# Patient Record
Sex: Female | Born: 1995 | Hispanic: Yes | Marital: Single | Smoking: Never smoker
Health system: Southern US, Community
[De-identification: ages and names within clinical notes are randomized; demographics above are authoritative.]

## PROBLEM LIST (undated history)

## (undated) ENCOUNTER — Inpatient Hospital Stay (HOSPITAL_COMMUNITY): Payer: Self-pay

## (undated) DIAGNOSIS — J45909 Unspecified asthma, uncomplicated: Secondary | ICD-10-CM

## (undated) HISTORY — PX: TYMPANOSTOMY TUBE PLACEMENT: SHX32

## (undated) HISTORY — PX: TONSILLECTOMY: SUR1361

---
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
## 2016-11-07 ENCOUNTER — Encounter: Payer: Self-pay | Admitting: Family Medicine

## 2016-11-07 ENCOUNTER — Ambulatory Visit: Payer: Self-pay | Attending: Family Medicine | Admitting: Family Medicine

## 2016-11-07 VITALS — BP 136/83 | HR 102 | Temp 97.8°F | Resp 20 | Ht 64.0 in | Wt 206.0 lb

## 2016-11-07 DIAGNOSIS — J45909 Unspecified asthma, uncomplicated: Secondary | ICD-10-CM | POA: Insufficient documentation

## 2016-11-07 DIAGNOSIS — Z0000 Encounter for general adult medical examination without abnormal findings: Secondary | ICD-10-CM | POA: Insufficient documentation

## 2016-11-07 DIAGNOSIS — N926 Irregular menstruation, unspecified: Secondary | ICD-10-CM | POA: Insufficient documentation

## 2016-11-07 DIAGNOSIS — Z87891 Personal history of nicotine dependence: Secondary | ICD-10-CM | POA: Insufficient documentation

## 2016-11-07 DIAGNOSIS — Z8709 Personal history of other diseases of the respiratory system: Secondary | ICD-10-CM

## 2016-11-07 LAB — POCT URINALYSIS DIPSTICK
Bilirubin, UA: NEGATIVE
Blood, UA: NEGATIVE
Glucose, UA: NEGATIVE
KETONES UA: NEGATIVE
LEUKOCYTES UA: NEGATIVE
NITRITE UA: NEGATIVE
PH UA: 6.5
PROTEIN UA: 30
Spec Grav, UA: >=1.03
Urobilinogen, UA: 1

## 2016-11-07 LAB — POCT GLYCOSYLATED HEMOGLOBIN (HGB A1C): Hemoglobin A1C: 5.3

## 2016-11-07 LAB — POCT URINE PREGNANCY: PREG TEST UR: NEGATIVE

## 2016-11-07 MED ORDER — NORGESTIM-ETH ESTRAD TRIPHASIC 0.18/0.215/0.25 MG-35 MCG PO TABS: 1.0000 | ORAL_TABLET | Freq: Every day | ORAL | 11 refills | Status: DC

## 2016-11-07 MED ORDER — ALBUTEROL SULFATE HFA 108 (90 BASE) MCG/ACT IN AERS: 2.0000 | INHALATION_SPRAY | RESPIRATORY_TRACT | 0 refills | Status: DC | PRN

## 2016-11-07 NOTE — Progress Notes (Signed)
Establish care.  Denies any issues or concerns at this time

## 2016-11-16 NOTE — Progress Notes (Signed)
Subjective:   Chief Complaint  Patient presents with  . Annual Exam   HPI Anna Choi 20 y.o. female presents comes to office for annual physical examination. She denies any vision changes. She denies any CP or swelling of the extremities. She does report SOB. She does reports a history of asthma with inhaler use in the past. She denies any polyuria, polyphagia, or polyphagia. She denies any changes in bowel or bladder habits. She does report a history of irregular periods. She reports being without a menstrual cycle for up to 3 months at a time. She reports menstrual cycles are typically 4 days long. She does report oral contraceptive use in the past to help regulate cycles. She denies smoking. She denies any history of clotting disorders.  Social History   Social History  . Marital status: Single    Spouse name: N/A  . Number of children: N/A  . Years of education: N/A   Social History Main Topics  . Smoking status: Former Games developermoker  . Smokeless tobacco: Former NeurosurgeonUser  . Alcohol use No  . Drug use: No  . Sexual activity: Not on file   Social History Narrative  . No narrative on file   No outpatient prescriptions prior to visit.   No facility-administered medications prior to visit.    No Known Allergies  Review of Systems  Constitutional: Negative.   HENT: Negative.   Eyes: Negative.   Respiratory: Negative.   Cardiovascular: Negative.   Gastrointestinal: Negative.   Genitourinary: Negative.   Musculoskeletal: Negative.   Skin: Negative.   Neurological: Negative.   Psychiatric/Behavioral: Negative.      Objective:    Physical Exam  Constitutional: She is oriented to person, place, and time. She appears well-developed and well-nourished. No distress.  HENT:  Right Ear: External ear normal.  Left Ear: External ear normal.  Nose: Nose normal.  Mouth/Throat: Oropharynx is clear and moist. No oropharyngeal exudate.  Eyes: Conjunctivae and EOM are normal.  Pupils are equal, round, and reactive to light. Right eye exhibits no discharge. Left eye exhibits no discharge. No scleral icterus.  Neck: Normal range of motion. Neck supple. No JVD present.  Cardiovascular: Normal rate, regular rhythm, normal heart sounds and intact distal pulses.   Pulmonary/Chest: Effort normal and breath sounds normal. No respiratory distress.  Abdominal: Soft. Bowel sounds are normal. She exhibits no distension and no mass. There is no tenderness.  Musculoskeletal: Normal range of motion.  Lymphadenopathy:    She has no cervical adenopathy.  Neurological: She is alert and oriented to person, place, and time.  Skin: Skin is warm and dry. She is not diaphoretic.  Psychiatric: She has a normal mood and affect. Her behavior is normal. Thought content normal.   BP 136/83   Pulse (!) 102   Temp 97.8 F (36.6 C) (Oral)   Resp 20   Ht 5\' 4"  (1.626 m)   Wt 206 lb (93.4 kg)   LMP 07/08/2016 (Approximate)   SpO2 100%   BMI 35.36 kg/m  Wt Readings from Last 3 Encounters:  11/07/16 206 lb (93.4 kg)    There is no immunization history on file for this patient.  Lab Results  Component Value Date   HGBA1C 5.3 11/07/2016     Assessment & Plan:   Problem List Items Addressed This Visit    None    Visit Diagnoses    Encounter for annual physical exam    -  Primary   Relevant Orders  HgB A1c (Completed)   POCT urine pregnancy (Completed)   Vitamin D, 25-hydroxy   Basic Metabolic Panel   CBC with Differential   TSH   Urinalysis Dipstick (Completed)   Irregular periods       Relevant Medications   Norgestimate-Ethinyl Estradiol Triphasic (ORTHO TRI-CYCLEN, 28,) 0.18/0.215/0.25 MG-35 MCG tablet   Other Relevant Orders   Prolactin   FSH/LH   History of asthma       Relevant Medications   albuterol (PROVENTIL HFA;VENTOLIN HFA) 108 (90 Base) MCG/ACT inhaler     I am having Ms. Vetere start on albuterol and Norgestimate-Ethinyl Estradiol  Triphasic.  Meds ordered this encounter  Medications  . albuterol (PROVENTIL HFA;VENTOLIN HFA) 108 (90 Base) MCG/ACT inhaler    Sig: Inhale 2 puffs into the lungs every 4 (four) hours as needed for wheezing or shortness of breath.    Dispense:  1 Inhaler    Refill:  0    Order Specific Question:   Supervising Provider    Answer:   Quentin AngstJEGEDE, OLUGBEMIGA E [id number]  . Norgestimate-Ethinyl Estradiol Triphasic (ORTHO TRI-CYCLEN, 28,) 0.18/0.215/0.25 MG-35 MCG tablet    Sig: Take 1 tablet by mouth daily.    Dispense:  1 Package    Refill:  11    Order Specific Question:   Supervising Provider    Answer:   Quentin AngstJEGEDE, OLUGBEMIGA E [[id number]]    Arrie SenateMandesia Rashaan Wyles, FNP

## 2017-04-02 ENCOUNTER — Ambulatory Visit: Payer: Self-pay | Attending: Family Medicine

## 2017-04-03 ENCOUNTER — Encounter: Payer: Self-pay | Admitting: Family Medicine

## 2017-04-03 ENCOUNTER — Ambulatory Visit: Payer: Self-pay | Attending: Family Medicine | Admitting: Family Medicine

## 2017-04-03 VITALS — BP 133/80 | HR 86 | Temp 98.2°F | Resp 18 | Ht 64.0 in | Wt 207.2 lb

## 2017-04-03 DIAGNOSIS — Z8709 Personal history of other diseases of the respiratory system: Secondary | ICD-10-CM

## 2017-04-03 DIAGNOSIS — N911 Secondary amenorrhea: Secondary | ICD-10-CM

## 2017-04-03 DIAGNOSIS — J309 Allergic rhinitis, unspecified: Secondary | ICD-10-CM

## 2017-04-03 DIAGNOSIS — J45909 Unspecified asthma, uncomplicated: Secondary | ICD-10-CM | POA: Insufficient documentation

## 2017-04-03 DIAGNOSIS — J310 Chronic rhinitis: Secondary | ICD-10-CM | POA: Insufficient documentation

## 2017-04-03 DIAGNOSIS — M26623 Arthralgia of bilateral temporomandibular joint: Secondary | ICD-10-CM

## 2017-04-03 LAB — POCT URINE PREGNANCY: PREG TEST UR: NEGATIVE

## 2017-04-03 MED ORDER — IPRATROPIUM-ALBUTEROL 0.5-2.5 (3) MG/3ML IN SOLN: 3.0000 mL | RESPIRATORY_TRACT | Status: DC | PRN

## 2017-04-03 MED ORDER — MONTELUKAST SODIUM 10 MG PO TABS: 10.0000 mg | ORAL_TABLET | Freq: Every day | ORAL | 3 refills | Status: DC

## 2017-04-03 MED ORDER — ALBUTEROL SULFATE HFA 108 (90 BASE) MCG/ACT IN AERS: 2.0000 | INHALATION_SPRAY | RESPIRATORY_TRACT | 3 refills | Status: DC | PRN

## 2017-04-03 MED ORDER — ACETAMINOPHEN 500 MG PO TABS: 500.0000 mg | ORAL_TABLET | Freq: Four times a day (QID) | ORAL | 0 refills | Status: DC | PRN

## 2017-04-03 MED ORDER — FLUTICASONE PROPIONATE 50 MCG/ACT NA SUSP: 2.0000 | Freq: Every day | NASAL | 6 refills | Status: DC

## 2017-04-03 NOTE — Patient Instructions (Addendum)
Apply for orange card to complete referral process.  Asthma, Adult Asthma is a condition of the lungs in which the airways tighten and narrow. Asthma can make it hard to breathe. Asthma cannot be cured, but medicine and lifestyle changes can help control it. Asthma may be started (triggered) by:  Animal skin flakes (dander).  Dust.  Cockroaches.  Pollen.  Mold.  Smoke.  Cleaning products.  Hair sprays or aerosol sprays.  Paint fumes or strong smells.  Cold air, weather changes, and winds.  Crying or laughing hard.  Stress.  Certain medicines or drugs.  Foods, such as dried fruit, potato chips, and sparkling grape juice.  Infections or conditions (colds, flu).  Exercise.  Certain medical conditions or diseases.  Exercise or tiring activities. Follow these instructions at home:  Take medicine as told by your doctor.  Use a peak flow meter as told by your doctor. A peak flow meter is a tool that measures how well the lungs are working.  Record and keep track of the peak flow meter's readings.  Understand and use the asthma action plan. An asthma action plan is a written plan for taking care of your asthma and treating your attacks.  To help prevent asthma attacks:  Do not smoke. Stay away from secondhand smoke.  Change your heating and air conditioning filter often.  Limit your use of fireplaces and wood stoves.  Get rid of pests (such as roaches and mice) and their droppings.  Throw away plants if you see mold on them.  Clean your floors. Dust regularly. Use cleaning products that do not smell.  Have someone vacuum when you are not home. Use a vacuum cleaner with a HEPA filter if possible.  Replace carpet with wood, tile, or vinyl flooring. Carpet can trap animal skin flakes and dust.  Use allergy-proof pillows, mattress covers, and box spring covers.  Wash bed sheets and blankets every week in hot water and dry them in a dryer.  Use blankets that  are made of polyester or cotton.  Clean bathrooms and kitchens with bleach. If possible, have someone repaint the walls in these rooms with mold-resistant paint. Keep out of the rooms that are being cleaned and painted.  Wash hands often. Contact a doctor if:  You have make a whistling sound when breaking (wheeze), have shortness of breath, or have a cough even if taking medicine to prevent attacks.  The colored mucus you cough up (sputum) is thicker than usual.  The colored mucus you cough up changes from clear or white to yellow, green, gray, or bloody.  You have problems from the medicine you are taking such as:  A rash.  Itching.  Swelling.  Trouble breathing.  You need reliever medicines more than 2-3 times a week.  Your peak flow measurement is still at 50-79% of your personal best after following the action plan for 1 hour.  You have a fever. Get help right away if:  You seem to be worse and are not responding to medicine during an asthma attack.  You are short of breath even at rest.  You get short of breath when doing very little activity.  You have trouble eating, drinking, or talking.  You have chest pain.  You have a fast heartbeat.  Your lips or fingernails start to turn blue.  You are light-headed, dizzy, or faint.  Your peak flow is less than 50% of your personal best. This information is not intended to replace advice given  to you by your health care provider. Make sure you discuss any questions you have with your health care provider. Document Released: 04/29/2008 Document Revised: 04/18/2016 Document Reviewed: 06/10/2013 Elsevier Interactive Patient Education  2017 [employer].

## 2017-04-03 NOTE — Progress Notes (Signed)
Patient complains sneezing coughing runny nose both ears pain & inside the mouth pain when biting    Patient denies fever & chills Patient is not taking any current medication  Patient has not eaten for today

## 2017-04-04 ENCOUNTER — Ambulatory Visit: Payer: Self-pay | Attending: Family Medicine

## 2017-04-04 LAB — TSH: TSH: 2 u[IU]/mL (ref 0.450–4.500)

## 2017-04-04 LAB — PROLACTIN: PROLACTIN: 9.4 ng/mL (ref 4.8–23.3)

## 2017-04-04 LAB — FSH/LH
FSH: 6.3 m[IU]/mL
LH: 10 m[IU]/mL

## 2017-04-08 NOTE — Progress Notes (Signed)
Subjective:  Patient ID: Anna Choi, female    DOB: 07-08-1996  Age: 21 y.o. MRN: [id number]  CC: Establish Care   HPI Anna Choi presents for s here for evaluation of possible allergic rhinitis. Patient's symptoms include clear rhinorrhea, postnasal drip, pressure sensation in ears and sneezing. These symptoms are seasonal. Current triggers include exposure to pollens. The patient has been suffering from these symptoms for approximately several  weeks. The patient has not  Tried anything for symptoms.  Immunotherapy has never been tried. The patient has never had nasal polyps. The patient reports past medical history of asthma. She cannot recall the age at which was diagnosed. She reports being without her asthma medications for several years.The patient does not suffer from frequent sinopulmonary infections. The patient has not had sinus surgery in the past. The patient has no history of eczema. She also reports symptoms of pain at the mandibular joint when chewing and biting. She denies any history of bruxism. Denies any clicking or popping sensations.  History of secondary amenorrhea: She does report a history of irregular periods. She reports being without a menstrual cycle for up to 3 months at a time. She reports menstrual cycles are typically 4 days long. She does report oral contraceptive use in the past to help regulate cycles. She denies smoking. She denies any history of clotting disorders. Previous workup orders included: FSH/LH, prolactin, and  urine pregnancy. Was prescribed oral contraceptives previously  but denies taking.     Outpatient Medications Prior to Visit  Medication Sig Dispense Refill  . albuterol (PROVENTIL HFA;VENTOLIN HFA) 108 (90 Base) MCG/ACT inhaler Inhale 2 puffs into the lungs every 4 (four) hours as needed for wheezing or shortness of breath. (Patient not taking: Reported on 04/03/2017) 1 Inhaler 0  . Norgestimate-Ethinyl Estradiol Triphasic  (ORTHO TRI-CYCLEN, 28,) 0.18/0.215/0.25 MG-35 MCG tablet Take 1 tablet by mouth daily. 1 Package 11   No facility-administered medications prior to visit.     ROS Review of Systems  Constitutional: Negative.   HENT: Positive for ear pain, postnasal drip, rhinorrhea and sneezing.   Eyes: Negative.   Respiratory: Negative.   Cardiovascular: Negative.   Musculoskeletal: Positive for arthralgias.  Neurological: Negative.   Psychiatric/Behavioral: Negative.     Objective:  BP 133/80 (BP Location: Left Arm, Patient Position: Sitting, Cuff Size: Normal)   Pulse 86   Temp 98.2 F (36.8 C) (Oral)   Resp 18   Ht 5\' 4"  (1.626 m)   Wt 207 lb 3.2 oz (94 kg)   SpO2 99%   BMI 35.57 kg/m   BP/Weight 04/03/2017 11/07/2016  Systolic BP 133 136  Diastolic BP 80 83  Wt. (Lbs) 207.2 206  BMI 35.57 35.36    Physical Exam  Constitutional: She appears well-developed and well-nourished.  HENT:  Head: Normocephalic and atraumatic.  Right Ear: External ear normal.  Left Ear: External ear normal.  Nose: Nose normal.  Mouth/Throat: Oropharynx is clear and moist.  TMJ tenderness and pain with palpation when opening and closing mouth.  Eyes: Conjunctivae are normal. Pupils are equal, round, and reactive to light.  Neck: No JVD present.  Cardiovascular: Normal rate, regular rhythm, normal heart sounds and intact distal pulses.   Pulmonary/Chest: Effort normal. No respiratory distress. She has wheezes.  Abdominal: Soft. Bowel sounds are normal.  Skin: Skin is warm and dry.  Nursing note and vitals reviewed.  Assessment & Plan:   Problem List Items Addressed This Visit    None  Visit Diagnoses    Allergic rhinitis, unspecified seasonality, unspecified trigger    -  Primary   Relevant Medications   montelukast (SINGULAIR) 10 MG tablet   fluticasone (FLONASE) 50 MCG/ACT nasal spray   History of asthma       Due to pt.unclear self reported history of  asthma and presentation in office  today   Recommend referral to pulmonary for PFT to confirm asthma diagnosis.    Relevant Medications   albuterol (PROVENTIL HFA;VENTOLIN HFA) 108 (90 Base) MCG/ACT inhaler   ipratropium-albuterol (DUONEB) 0.5-2.5 (3) MG/3ML nebulizer solution 3 mL   Other Relevant Orders   Ambulatory referral to Pulmonology   Secondary amenorrhea       Relevant Orders   FSH/LH (Completed)   Prolactin (Completed)   TSH (Completed)   POCT urine pregnancy (Completed)   Ambulatory referral to Gynecology   TMJ tenderness, bilateral       Relevant Medications   acetaminophen (TYLENOL) 500 MG tablet   Other Relevant Orders   Ambulatory referral to ENT      Meds ordered this encounter  Medications  . albuterol (PROVENTIL HFA;VENTOLIN HFA) 108 (90 Base) MCG/ACT inhaler    Sig: Inhale 2 puffs into the lungs every 4 (four) hours as needed for wheezing or shortness of breath.    Dispense:  1 Inhaler    Refill:  3    Order Specific Question:   Supervising Provider    Answer:   Quentin Angst [id number]  . ipratropium-albuterol (DUONEB) 0.5-2.5 (3) MG/3ML nebulizer solution 3 mL  . montelukast (SINGULAIR) 10 MG tablet    Sig: Take 1 tablet (10 mg total) by mouth at bedtime.    Dispense:  90 tablet    Refill:  3    Order Specific Question:   Supervising Provider    Answer:   Quentin Angst [id number]  . fluticasone (FLONASE) 50 MCG/ACT nasal spray    Sig: Place 2 sprays into both nostrils daily.    Dispense:  16 g    Refill:  6    Order Specific Question:   Supervising Provider    Answer:   Quentin Angst [id number]  . acetaminophen (TYLENOL) 500 MG tablet    Sig: Take 1 tablet (500 mg total) by mouth every 6 (six) hours as needed for moderate pain.    Dispense:  30 tablet    Refill:  0    Order Specific Question:   Supervising Provider    Answer:   Quentin Angst [id number]    Follow-up: Return in about 1 month (around 05/04/2017) for Allergic rhinitis.   Lizbeth Bark  FNP

## 2017-04-10 ENCOUNTER — Telehealth: Payer: Self-pay

## 2017-04-10 NOTE — Telephone Encounter (Signed)
CMA patient call  regarding results  Patient Verify DOB  Patient was aware and understood

## 2017-04-10 NOTE — Telephone Encounter (Signed)
-----   Message from Lizbeth BarkMandesia R Hairston, FNP sent at 04/10/2017  3:03 PM EDT ----- FSH/LH indicate normal ovarian function. TSH/ prolactin levels are normal. When abnormal it can cause absence of menstrual cycles.  Follow up with gynecology referral.

## 2017-05-06 ENCOUNTER — Ambulatory Visit: Payer: Self-pay | Admitting: Family Medicine

## 2017-05-13 ENCOUNTER — Encounter: Payer: Self-pay | Admitting: Obstetrics & Gynecology

## 2017-06-23 ENCOUNTER — Encounter: Payer: Self-pay | Admitting: Obstetrics & Gynecology

## 2017-12-31 ENCOUNTER — Inpatient Hospital Stay (HOSPITAL_COMMUNITY)
Admission: AD | Admit: 2017-12-31 | Discharge: 2017-12-31 | Disposition: A | Discharge status: Home / Self Care | Payer: Medicaid Other | Source: Ambulatory Visit | Attending: Obstetrics and Gynecology | Admitting: Obstetrics and Gynecology

## 2017-12-31 ENCOUNTER — Encounter (HOSPITAL_COMMUNITY): Payer: Self-pay | Admitting: *Deleted

## 2017-12-31 ENCOUNTER — Other Ambulatory Visit: Payer: Self-pay

## 2017-12-31 DIAGNOSIS — Z87891 Personal history of nicotine dependence: Secondary | ICD-10-CM | POA: Insufficient documentation

## 2017-12-31 DIAGNOSIS — Z3A10 10 weeks gestation of pregnancy: Secondary | ICD-10-CM | POA: Insufficient documentation

## 2017-12-31 DIAGNOSIS — O219 Vomiting of pregnancy, unspecified: Secondary | ICD-10-CM | POA: Diagnosis not present

## 2017-12-31 DIAGNOSIS — Z79899 Other long term (current) drug therapy: Secondary | ICD-10-CM | POA: Insufficient documentation

## 2017-12-31 DIAGNOSIS — Z9889 Other specified postprocedural states: Secondary | ICD-10-CM | POA: Insufficient documentation

## 2017-12-31 DIAGNOSIS — Z3201 Encounter for pregnancy test, result positive: Secondary | ICD-10-CM

## 2017-12-31 DIAGNOSIS — O210 Mild hyperemesis gravidarum: Secondary | ICD-10-CM | POA: Diagnosis not present

## 2017-12-31 DIAGNOSIS — J45909 Unspecified asthma, uncomplicated: Secondary | ICD-10-CM | POA: Diagnosis not present

## 2017-12-31 DIAGNOSIS — M549 Dorsalgia, unspecified: Secondary | ICD-10-CM | POA: Diagnosis not present

## 2017-12-31 DIAGNOSIS — O99511 Diseases of the respiratory system complicating pregnancy, first trimester: Secondary | ICD-10-CM | POA: Insufficient documentation

## 2017-12-31 DIAGNOSIS — O26891 Other specified pregnancy related conditions, first trimester: Secondary | ICD-10-CM | POA: Insufficient documentation

## 2017-12-31 HISTORY — DX: Unspecified asthma, uncomplicated: J45.909

## 2017-12-31 LAB — URINALYSIS, ROUTINE W REFLEX MICROSCOPIC
BILIRUBIN URINE: NEGATIVE
Glucose, UA: NEGATIVE mg/dL
HGB URINE DIPSTICK: NEGATIVE
Ketones, ur: NEGATIVE mg/dL
NITRITE: NEGATIVE
PROTEIN: NEGATIVE mg/dL
SPECIFIC GRAVITY, URINE: 1.032 — AB (ref 1.005–1.030)
pH: 5 (ref 5.0–8.0)

## 2017-12-31 LAB — POCT PREGNANCY, URINE: PREG TEST UR: POSITIVE — AB

## 2017-12-31 MED ORDER — METOCLOPRAMIDE HCL 10 MG PO TABS: 10.0000 mg | ORAL_TABLET | Freq: Four times a day (QID) | ORAL | 0 refills | Status: DC

## 2017-12-31 MED ORDER — DOXYLAMINE-PYRIDOXINE 10-10 MG PO TBEC: 2.0000 | DELAYED_RELEASE_TABLET | Freq: Every day | ORAL | 0 refills | Status: DC

## 2017-12-31 MED ORDER — PROMETHAZINE HCL 25 MG PO TABS
12.5000 mg | ORAL_TABLET | Freq: Once | ORAL | Status: AC
  Administered 2017-12-31: 12.5 mg via ORAL
  Filled 2017-12-31: qty 1

## 2017-12-31 MED ORDER — RANITIDINE HCL 150 MG PO TABS: 150.0000 mg | ORAL_TABLET | Freq: Two times a day (BID) | ORAL | 0 refills | Status: DC

## 2017-12-31 NOTE — MAU Provider Note (Signed)
History     CSN: [id number]  Arrival date and time: 12/31/17 1003   First Provider Initiated Contact with Patient 12/31/17 1152      Chief Complaint  Patient presents with  . Back Pain  . Nausea   HPI  Ms.  Anna Choi is a 22 y.o. year old G1P0 female at [redacted]w[redacted]d weeks gestation who presents to MAU reporting back pain, amenorrhea, nausea, and sore breasts for about 1-2 months. She states "I have a feeling I'm pregnant, but I have not taken a test at home. This is an unplanned, but desired pregnancy.   Past Medical History:  Diagnosis Date  . Asthma     Past Surgical History:  Procedure Laterality Date  . TONSILLECTOMY      History reviewed. No pertinent family history.  Social History   Tobacco Use  . Smoking status: Former Games developer  . Smokeless tobacco: Former Engineer, water Use Topics  . Alcohol use: No  . Drug use: No    Allergies: No Known Allergies  Facility-Administered Medications Prior to Admission  Medication Dose Route Frequency Provider Last Rate Last Dose  . ipratropium-albuterol (DUONEB) 0.5-2.5 (3) MG/3ML nebulizer solution 3 mL  3 mL Nebulization Q20 Min PRN Arrie Senate R, FNP       Medications Prior to Admission  Medication Sig Dispense Refill Last Dose  . acetaminophen (TYLENOL) 500 MG tablet Take 1 tablet (500 mg total) by mouth every 6 (six) hours as needed for moderate pain. 30 tablet 0   . albuterol (PROVENTIL HFA;VENTOLIN HFA) 108 (90 Base) MCG/ACT inhaler Inhale 2 puffs into the lungs every 4 (four) hours as needed for wheezing or shortness of breath. 1 Inhaler 3   . fluticasone (FLONASE) 50 MCG/ACT nasal spray Place 2 sprays into both nostrils daily. 16 g 6   . montelukast (SINGULAIR) 10 MG tablet Take 1 tablet (10 mg total) by mouth at bedtime. 90 tablet 3     Review of Systems  Constitutional: Negative.   HENT: Negative.   Eyes: Negative.   Cardiovascular: Negative.   Gastrointestinal: Positive for diarrhea and  vomiting. Constipation: a few weeks ago; now resolved.  Endocrine: Negative.   Genitourinary: Negative.   Musculoskeletal: Positive for back pain.  Skin: Negative.   Allergic/Immunologic: Negative.   Neurological: Negative.   Hematological: Negative.   Psychiatric/Behavioral: Negative.    Physical Exam   Blood pressure 115/61, pulse 84, temperature 98.4 F (36.9 C), temperature source Oral, resp. rate 16, weight 184 lb 12 oz (83.8 kg), last menstrual period 10/18/2017, SpO2 100 %.  Physical Exam  Nursing note and vitals reviewed. Constitutional: She is oriented to person, place, and time. She appears well-developed and well-nourished.  HENT:  Head: Normocephalic.  Eyes: Pupils are equal, round, and reactive to light.  Neck: Normal range of motion.  Cardiovascular: Normal rate, regular rhythm and normal heart sounds.  Respiratory: Effort normal and breath sounds normal.  GI: Soft. Bowel sounds are normal.  Genitourinary:  Genitourinary Comments: Pelvic deferred  Musculoskeletal: Normal range of motion.  Neurological: She is alert and oriented to person, place, and time. She has normal reflexes.  Skin: Skin is warm and dry.  Psychiatric: She has a normal mood and affect. Her behavior is normal. Judgment and thought content normal.    MAU Course  Procedures  MDM CCUA UPT UCx Phenergan 12.5 mg po -- nausea improved, tolerating PO fluids  Results for orders placed or performed during the hospital encounter of 12/31/17 (from  the past 24 hour(s))  Urinalysis, Routine w reflex microscopic     Status: Abnormal   Collection Time: 12/31/17 10:03 AM  Result Value Ref Range   Color, Urine YELLOW YELLOW   APPearance HAZY (A) CLEAR   Specific Gravity, Urine 1.032 (H) 1.005 - 1.030   pH 5.0 5.0 - 8.0   Glucose, UA NEGATIVE NEGATIVE mg/dL   Hgb urine dipstick NEGATIVE NEGATIVE   Bilirubin Urine NEGATIVE NEGATIVE   Ketones, ur NEGATIVE NEGATIVE mg/dL   Protein, ur NEGATIVE  NEGATIVE mg/dL   Nitrite NEGATIVE NEGATIVE   Leukocytes, UA MODERATE (A) NEGATIVE   RBC / HPF 0-5 0 - 5 RBC/hpf   WBC, UA TOO NUMEROUS TO COUNT 0 - 5 WBC/hpf   Bacteria, UA RARE (A) NONE SEEN   Squamous Epithelial / LPF 0-5 (A) NONE SEEN   Mucus PRESENT   Pregnancy, urine POC     Status: Abnormal   Collection Time: 12/31/17 10:34 AM  Result Value Ref Range   Preg Test, Ur POSITIVE (A) NEGATIVE    Assessment and Plan  Morning sickness - Rx for Reglan 10 mg every 6 hrs prn N/V - Rx for Zantac 150 mg BID - Rx for Diclegis 10 mg 2 tabs hs - Information provided on morning sickness, eating plan for N/V, prenatal care - List of [place] [facility] providers given - List of Safe Medications in pregnancy given - Discharge home - Patient verbalized an understanding of the plan of care and agrees.   Raelyn Moraolitta Hilton Saephan, MSN, CNM 12/31/2017, 11:59 AM

## 2017-12-31 NOTE — MAU Note (Signed)
Back pain and nausea, started about 2 months ago.  Has not done a home test.

## 2017-12-31 NOTE — Discharge Instructions (Signed)
[facility]  ° ° °[facility]       Phone: [phone] ° °[facility] Phone: [phone] ° °[facility]  Phone: [phone] ° °[facility]  Phone: [phone] ° °[facility]  Phone: [phone] ° °[facility]       Phone: [phone] ° °[facility]    Phone: [phone]  ° °[facility] ([place]    Phone: [phone] ° °[facility]    Phone: [phone] ° °[facility]    Phone: [phone] ° °[facility]    Phone: [phone] ° °[facility]       Phone: [phone]  ° °[facility]  Phone: [phone] ° °[facility]    Phone: [phone] ° °[facility]   Phone: [phone] ° °[facility]      Phone: [phone] ° °[facility]    Phone: [phone] ° °Safe Medications in Pregnancy  ° °Acne: °Benzoyl Peroxide °Salicylic Acid ° °Backache/Headache: °Tylenol: 2 regular strength every 4 hours OR °             2 Extra strength every 6 hours ° °Colds/Coughs/Allergies: °Benadryl (alcohol free) 25 mg every 6 hours as needed °Breath right strips °Claritin °Cepacol throat lozenges °Chloraseptic throat spray °Cold-Eeze- up to three times per day °Cough drops, alcohol free °Flonase (by prescription only) °Guaifenesin °Mucinex °Robitussin DM (plain only, alcohol free) °Saline nasal spray/drops °Sudafed (pseudoephedrine) & Actifed ** use only after [redacted] weeks gestation and if you do not have high blood pressure °Tylenol °Vicks Vaporub °Zinc lozenges °Zyrtec  ° °Constipation: °Colace °Ducolax suppositories °Fleet enema °Glycerin suppositories °Metamucil °Milk of  magnesia °Miralax °Senokot °Smooth move tea ° °Diarrhea: °Kaopectate °Imodium A-D ° °*NO pepto Bismol ° °Hemorrhoids: °Anusol °Anusol HC °Preparation H °Tucks ° °Indigestion: °Tums °Maalox °Mylanta °Zantac  °Pepcid ° °Insomnia: °Benadryl (alcohol free) 25mg every 6 hours as needed °Tylenol PM °Unisom, no Gelcaps ° °Leg Cramps: °Tums °MagGel ° °Nausea/Vomiting:  °Bonine °Dramamine °Emetrol °Ginger extract °Sea bands °Meclizine  °Nausea medication to take during pregnancy:  °Unisom (doxylamine succinate 25 mg tablets) Take one tablet daily at bedtime. If symptoms are not adequately controlled, the dose can be increased to a maximum recommended dose of two tablets daily (1/2 tablet in the morning, 1/2 tablet mid-afternoon and one at bedtime). °Vitamin B6 100mg tablets. Take one tablet twice a day (up to 200 mg per day). ° °Skin Rashes: °Aveeno products °Benadryl cream or 25mg every 6 hours as needed °Calamine Lotion °1% cortisone cream ° °Yeast infection: °Gyne-lotrimin 7 °Monistat 7 ° ° °**If taking multiple medications, please check labels to avoid duplicating the same active ingredients °**take medication as directed on the label °** Do not exceed 4000 mg of tylenol in 24 hours °**Do not take medications that contain aspirin or ibuprofen ° ° ° ° °

## 2018-01-02 ENCOUNTER — Telehealth: Payer: Self-pay | Admitting: Obstetrics and Gynecology

## 2018-01-02 LAB — CULTURE, OB URINE
Culture: >=100000 — AB
SPECIAL REQUESTS: NORMAL

## 2018-01-02 MED ORDER — CEPHALEXIN 500 MG PO CAPS: 500.0000 mg | ORAL_CAPSULE | Freq: Four times a day (QID) | ORAL | 0 refills | Status: DC

## 2018-01-02 NOTE — Telephone Encounter (Signed)
Notified of (+) UCx results and Rx for Keflex sent to pharmacy. Advised to take as directed. Patient verbalized an understanding of the plan of care and agrees.   Raelyn MoraRolitta Gudelia Eugene, CNM  01/02/2018 12:34 PM

## 2018-01-21 ENCOUNTER — Encounter: Payer: Self-pay | Admitting: Obstetrics and Gynecology

## 2018-01-21 ENCOUNTER — Other Ambulatory Visit (HOSPITAL_COMMUNITY)
Admission: RE | Admit: 2018-01-21 | Discharge: 2018-01-21 | Disposition: A | Discharge status: Home / Self Care | Payer: Medicaid Other | Source: Ambulatory Visit | Attending: Obstetrics and Gynecology | Admitting: Obstetrics and Gynecology

## 2018-01-21 ENCOUNTER — Ambulatory Visit (INDEPENDENT_AMBULATORY_CARE_PROVIDER_SITE_OTHER): Payer: Medicaid Other | Admitting: Obstetrics and Gynecology

## 2018-01-21 ENCOUNTER — Other Ambulatory Visit: Payer: Self-pay

## 2018-01-21 VITALS — BP 125/62 | HR 84 | Wt 188.8 lb

## 2018-01-21 DIAGNOSIS — Z3400 Encounter for supervision of normal first pregnancy, unspecified trimester: Secondary | ICD-10-CM | POA: Insufficient documentation

## 2018-01-21 DIAGNOSIS — Z3402 Encounter for supervision of normal first pregnancy, second trimester: Secondary | ICD-10-CM | POA: Insufficient documentation

## 2018-01-21 DIAGNOSIS — O99719 Diseases of the skin and subcutaneous tissue complicating pregnancy, unspecified trimester: Secondary | ICD-10-CM

## 2018-01-21 DIAGNOSIS — L811 Chloasma: Secondary | ICD-10-CM

## 2018-01-21 DIAGNOSIS — O99711 Diseases of the skin and subcutaneous tissue complicating pregnancy, first trimester: Secondary | ICD-10-CM

## 2018-01-21 LAB — POCT URINALYSIS DIP (DEVICE)
GLUCOSE, UA: NEGATIVE mg/dL
Hgb urine dipstick: NEGATIVE
KETONES UR: NEGATIVE mg/dL
LEUKOCYTES UA: NEGATIVE
NITRITE: NEGATIVE
PROTEIN: NEGATIVE mg/dL
UROBILINOGEN UA: 0.2 mg/dL (ref 0.0–1.0)
pH: 5.5 (ref 5.0–8.0)

## 2018-01-21 MED ORDER — PRENATAL 27-0.8 MG PO TABS: 1.0000 | ORAL_TABLET | Freq: Every day | ORAL | 12 refills | Status: DC

## 2018-01-21 MED ORDER — RANITIDINE HCL 150 MG PO TABS: 150.0000 mg | ORAL_TABLET | Freq: Two times a day (BID) | ORAL | 0 refills | Status: DC

## 2018-01-21 MED ORDER — DOXYLAMINE-PYRIDOXINE 10-10 MG PO TBEC: 2.0000 | DELAYED_RELEASE_TABLET | Freq: Every day | ORAL | 0 refills | Status: DC

## 2018-01-21 MED ORDER — METOCLOPRAMIDE HCL 10 MG PO TABS: 10.0000 mg | ORAL_TABLET | Freq: Four times a day (QID) | ORAL | 0 refills | Status: DC

## 2018-01-21 NOTE — Patient Instructions (Signed)
Morning Sickness Morning sickness is when you feel sick to your stomach (nauseous) during pregnancy. You may feel sick to your stomach and throw up (vomit). You may feel sick in the morning, but you can feel this way any time of day. Some women feel very sick to their stomach and cannot stop throwing up (hyperemesis gravidarum). Follow these instructions at home: Only take medicines as told by your doctor. Take multivitamins as told by your doctor. Taking multivitamins before getting pregnant can stop or lessen the harshness of morning sickness. Eat dry toast or unsalted crackers before getting out of bed. Eat 5 to 6 small meals a day. Eat dry and bland foods like rice and baked potatoes. Do not drink liquids with meals. Drink between meals. Do not eat greasy, fatty, or spicy foods. Have someone cook for you if the smell of food causes you to feel sick or throw up. If you feel sick to your stomach after taking prenatal vitamins, take them at night or with a snack. Eat protein when you need a snack (nuts, yogurt, cheese). Eat unsweetened gelatins for dessert. Wear a bracelet used for sea sickness (acupressure wristband). Go to a doctor that puts thin needles into certain body points (acupuncture) to improve how you feel. Do not smoke. Use a humidifier to keep the air in your house free of odors. Get lots of fresh air. Contact a doctor if: You need medicine to feel better. You feel dizzy or lightheaded. You are losing weight. Get help right away if: You feel very sick to your stomach and cannot stop throwing up. You pass out (faint). This information is not intended to replace advice given to you by your health care provider. Make sure you discuss any questions you have with your health care provider. Document Released: 12/19/2004 Document Revised: 04/18/2016 Document Reviewed: 04/28/2013 Elsevier Interactive Patient Education  2017 Elsevier Inc. Eating Plan for Hyperemesis  Gravidarum Hyperemesis gravidarum is a severe form of morning sickness. Because this condition causes severe nausea and vomiting, it can lead to dehydration, malnutrition, and weight loss. One way to lessen the symptoms of nausea and vomiting is to follow the eating plan for hyperemesis gravidarum. It is often used along with prescribed medicines to control your symptoms. What can I do to relieve my symptoms? Listen to your body. Everyone is different and has different preferences. Find what works best for you. Take any of the following actions that are helpful to you:  Eat and drink slowly.  Eat 5-6 small meals daily instead of 3 large meals.  Eat crackers before you get out of bed in the morning.  Try having a snack in the middle of the night.  Starchy foods are usually tolerated well. Examples include cereal, toast, bread, potatoes, pasta, rice, and pretzels.  Ginger may help with nausea. Add  tsp ground ginger to hot tea or choose ginger tea.  Try drinking 100% fruit juice or an electrolyte drink. An electrolyte drink contains sodium, potassium, and chloride.  Continue to take your prenatal vitamins as told by your health care provider. If you are having trouble taking your prenatal vitamins, talk with your health care provider about different options.  Include at least 1 serving of protein with your meals and snacks. Protein options include meats or poultry, beans, nuts, eggs, and yogurt. Try eating a protein-rich snack before bed. Examples of these snacks include cheese and crackers or half of a peanut butter or [place] sandwich.  Consider eliminating foods that  trigger your symptoms. These may include spicy foods, coffee, high-fat foods, very sweet foods, and acidic foods.  Try meals that have more protein combined with bland, salty, lower-fat, and dry foods, such as nuts, seeds, pretzels, crackers, and cereal.  Talk with your healthcare provider about starting a supplement of  vitamin B6.  Have fluids that are cold, clear, and carbonated or sour. Examples include lemonade, ginger ale, lemon-lime soda, ice water, and sparkling water.  Try lemon or mint tea.  Try brushing your teeth or using a mouth rinse after meals.  What should I avoid to reduce my symptoms? Avoiding some of the following things may help reduce your symptoms.  Foods with strong smells. Try eating meals in well-ventilated areas that are free of odors.  Drinking water or other beverages with meals. Try not to drink anything during the 30 minutes before and after your meals.  Drinking more than 1 cup of fluid at a time. Sometimes using a straw helps.  Fried or high-fat foods, such as butter and cream sauces.  Spicy foods.  Skipping meals as best as you can. Nausea can be more intense on an empty stomach. If you cannot tolerate food at that time, do not force it. Try sucking on ice chips or other frozen items, and make up for missed calories later.  Lying down within 2 hours after eating.  Environmental triggers. These may include smoky rooms, closed spaces, rooms with strong smells, warm or humid places, overly loud and noisy rooms, and rooms with motion or flickering lights.  Quick and sudden changes in your movement.  This information is not intended to replace advice given to you by your health care provider. Make sure you discuss any questions you have with your health care provider. Document Released: 09/08/2007 Document Revised: 07/10/2016 Document Reviewed: 06/11/2016 Elsevier Interactive Patient Education  2018 [employer]. Second Trimester of Pregnancy The second trimester is from week 13 through week 28, month 4 through 6. This is often the time in pregnancy that you feel your best. Often times, morning sickness has lessened or quit. You may have more energy, and you may get hungry more often. Your unborn baby (fetus) is growing rapidly. At the end of the sixth month, he or she  is about 9 inches long and weighs about 1 pounds. You will likely feel the baby move (quickening) between 18 and 20 weeks of pregnancy. Follow these instructions at home:  Avoid all smoking, herbs, and alcohol. Avoid drugs not approved by your doctor.  Do not use any tobacco products, including cigarettes, chewing tobacco, and electronic cigarettes. If you need help quitting, ask your doctor. You may get counseling or other support to help you quit.  Only take medicine as told by your doctor. Some medicines are safe and some are not during pregnancy.  Exercise only as told by your doctor. Stop exercising if you start having cramps.  Eat regular, healthy meals.  Wear a good support bra if your breasts are tender.  Do not use hot tubs, steam rooms, or saunas.  Wear your seat belt when driving.  Avoid raw meat, uncooked cheese, and liter boxes and soil used by cats.  Take your prenatal vitamins.  Take 1500-2000 milligrams of calcium daily starting at the 20th week of pregnancy until you deliver your baby.  Try taking medicine that helps you poop (stool softener) as needed, and if your doctor approves. Eat more fiber by eating fresh fruit, vegetables, and whole grains. Drink  enough fluids to keep your pee (urine) clear or pale yellow.  Take warm water baths (sitz baths) to soothe pain or discomfort caused by hemorrhoids. Use hemorrhoid cream if your doctor approves.  If you have puffy, bulging veins (varicose veins), wear support hose. Raise (elevate) your feet for 15 minutes, 3-4 times a day. Limit salt in your diet.  Avoid heavy lifting, wear low heals, and sit up straight.  Rest with your legs raised if you have leg cramps or low back pain.  Visit your dentist if you have not gone during your pregnancy. Use a soft toothbrush to brush your teeth. Be gentle when you floss.  You can have sex (intercourse) unless your doctor tells you not to.  Go to your doctor visits. Get help  if:  You feel dizzy.  You have mild cramps or pressure in your lower belly (abdomen).  You have a nagging pain in your belly area.  You continue to feel sick to your stomach (nauseous), throw up (vomit), or have watery poop (diarrhea).  You have bad smelling fluid coming from your vagina.  You have pain with peeing (urination). Get help right away if:  You have a fever.  You are leaking fluid from your vagina.  You have spotting or bleeding from your vagina.  You have severe belly cramping or pain.  You lose or gain weight rapidly.  You have trouble catching your breath and have chest pain.  You notice sudden or extreme puffiness (swelling) of your face, hands, ankles, feet, or legs.  You have not felt the baby move in over an hour.  You have severe headaches that do not go away with medicine.  You have vision changes. This information is not intended to replace advice given to you by your health care provider. Make sure you discuss any questions you have with your health care provider. Document Released: 02/05/2010 Document Revised: 04/18/2016 Document Reviewed: 01/12/2013 Elsevier Interactive Patient Education  2017 [employer]

## 2018-01-21 NOTE — Progress Notes (Signed)
  Subjective:    Anna Choi is being seen today for her first obstetrical visit.  This is not a planned pregnancy. She is at 4139w4d gestation. Her obstetrical history is significant for obesity. Relationship with FOB: significant other, not living together. Patient does intend to breast feed. Pregnancy history fully reviewed.  Patient reports heartburn, nausea and vomiting.  Review of Systems:   Review of Systems  Constitutional: Negative.   HENT: Negative.   Eyes: Negative.   Respiratory: Negative.   Cardiovascular: Negative.   Gastrointestinal: Positive for nausea and vomiting.  Endocrine: Negative.   Genitourinary: Negative.   Musculoskeletal: Negative.   Skin: Negative.   Allergic/Immunologic: Negative.   Neurological: Negative.   Hematological: Negative.   Psychiatric/Behavioral: Negative.     Objective:     BP 125/62   Pulse 84   Wt 188 lb 12.8 oz (85.6 kg)   LMP 10/18/2017 (Exact Date)   BMI 32.41 kg/m  Physical Exam  Nursing note and vitals reviewed. Constitutional: She is oriented to person, place, and time. She appears well-developed and well-nourished.  HENT:  Head: Normocephalic and atraumatic.  Right Ear: External ear normal.  Left Ear: External ear normal.  Nose: Nose normal.  Mouth/Throat: Oropharynx is clear and moist.  Eyes: Conjunctivae and EOM are normal. Pupils are equal, round, and reactive to light.  Neck: Normal range of motion. Neck supple.  Cardiovascular: Normal rate, regular rhythm, normal heart sounds and intact distal pulses.  Respiratory: Effort normal and breath sounds normal.  GI: Soft. Bowel sounds are normal.  Genitourinary: Vaginal discharge (moderate amt) found.  Genitourinary Comments: Uterus: enlarged, S=D, cx: smooth, pink, no lesions, moderate amt of thick, white vaginal d/c -- no wet prep done d/t no compliaints of vaginal irritation, burning, odor or itching, closed/long/firm, no CMT or friability, no adnexal  tenderness   Musculoskeletal: Normal range of motion.  Neurological: She is alert and oriented to person, place, and time. She has normal reflexes.  Skin: Skin is warm and dry.  Erythematous areas on forehead, nose, cheeks, and chin / ?rash vs. molasma of pregnancy on face - will need derm referral  Psychiatric: She has a normal mood and affect. Her behavior is normal. Judgment and thought content normal.    Maternal Exam:  Abdomen: Patient reports no abdominal tenderness. Introitus: Normal vulva. Vagina is positive for vaginal discharge (moderate amt).  Ferning test: not done.  Nitrazine test: not done. Amniotic fluid character: not assessed.  Pelvis: adequate for delivery.   Cervix: Cervix evaluated by sterile speculum exam and digital exam.    Dilation: Closed Effacement (%): Thick     Assessment:    Pregnancy: G1P0 Patient Active Problem List   Diagnosis Date Noted  . Supervision of normal first pregnancy, antepartum 01/21/2018  . Morning sickness 12/31/2017  . Positive urine pregnancy test 12/31/2017       Plan:     Initial labs drawn. Prenatal vitamins. Problem list reviewed and updated. AFP3 discussed: undecided. Role of ultrasound in pregnancy discussed; fetal survey: ordered. Discussed with patient that U/S was not medically indicated from today's visit. Anticipatory guidance given that anatomy U/S will be at 18 wks. Amniocentesis discussed: not indicated. Follow up in 7 weeks. Enrolled in [employer] optimized scheduling. 100% of 20 min visit spent on counseling and coordination of care.  Patient verbalized an understanding of the plan of care and agrees.    Raelyn Moraolitta Kentarius Partington, MSN, CNM 01/21/2018

## 2018-01-23 ENCOUNTER — Encounter: Payer: Self-pay | Admitting: *Deleted

## 2018-01-23 LAB — CYTOLOGY - PAP
CHLAMYDIA, DNA PROBE: NEGATIVE
NEISSERIA GONORRHEA: NEGATIVE

## 2018-01-23 LAB — CULTURE, OB URINE

## 2018-01-23 LAB — URINE CULTURE, OB REFLEX: ORGANISM ID, BACTERIA: NO GROWTH

## 2018-01-27 ENCOUNTER — Encounter (HOSPITAL_COMMUNITY): Payer: Self-pay | Admitting: *Deleted

## 2018-01-27 ENCOUNTER — Inpatient Hospital Stay (HOSPITAL_COMMUNITY)
Admission: AD | Admit: 2018-01-27 | Discharge: 2018-01-27 | Disposition: A | Discharge status: Home / Self Care | Payer: Medicaid Other | Source: Ambulatory Visit | Attending: Obstetrics and Gynecology | Admitting: Obstetrics and Gynecology

## 2018-01-27 DIAGNOSIS — R1012 Left upper quadrant pain: Secondary | ICD-10-CM | POA: Insufficient documentation

## 2018-01-27 DIAGNOSIS — O219 Vomiting of pregnancy, unspecified: Secondary | ICD-10-CM

## 2018-01-27 DIAGNOSIS — R109 Unspecified abdominal pain: Secondary | ICD-10-CM | POA: Diagnosis present

## 2018-01-27 DIAGNOSIS — N760 Acute vaginitis: Secondary | ICD-10-CM | POA: Diagnosis not present

## 2018-01-27 DIAGNOSIS — Z3A14 14 weeks gestation of pregnancy: Secondary | ICD-10-CM | POA: Diagnosis not present

## 2018-01-27 DIAGNOSIS — O26892 Other specified pregnancy related conditions, second trimester: Secondary | ICD-10-CM | POA: Diagnosis present

## 2018-01-27 DIAGNOSIS — B9689 Other specified bacterial agents as the cause of diseases classified elsewhere: Secondary | ICD-10-CM

## 2018-01-27 DIAGNOSIS — O210 Mild hyperemesis gravidarum: Secondary | ICD-10-CM | POA: Diagnosis not present

## 2018-01-27 DIAGNOSIS — Z3201 Encounter for pregnancy test, result positive: Secondary | ICD-10-CM

## 2018-01-27 DIAGNOSIS — O23592 Infection of other part of genital tract in pregnancy, second trimester: Secondary | ICD-10-CM | POA: Diagnosis not present

## 2018-01-27 LAB — WET PREP, GENITAL
Sperm: NONE SEEN
Trich, Wet Prep: NONE SEEN
YEAST WET PREP: NONE SEEN

## 2018-01-27 LAB — URINALYSIS, ROUTINE W REFLEX MICROSCOPIC
Bilirubin Urine: NEGATIVE
Glucose, UA: NEGATIVE mg/dL
Hgb urine dipstick: NEGATIVE
Ketones, ur: NEGATIVE mg/dL
Leukocytes, UA: NEGATIVE
NITRITE: NEGATIVE
PROTEIN: NEGATIVE mg/dL
SPECIFIC GRAVITY, URINE: 1.03 (ref 1.005–1.030)
pH: 5 (ref 5.0–8.0)

## 2018-01-27 MED ORDER — METRONIDAZOLE 500 MG PO TABS: 500.0000 mg | ORAL_TABLET | Freq: Two times a day (BID) | ORAL | 0 refills | Status: DC

## 2018-01-27 NOTE — MAU Note (Signed)
Patient states she is having sharp epigastric abdominal pain for past four days.  Reports she started a new job with heavy lifting 4 days ago and "I'm not sure if it's related to lifting too much at my new job."  Denies vaginal bleeding, but states having foul smelling discharge and vaginal itching.  Rates pain 7/10 but hasn't taken any medications for it.

## 2018-01-27 NOTE — Discharge Instructions (Signed)
Bacterial Vaginosis Bacterial vaginosis is a vaginal infection that occurs when the normal balance of bacteria in the vagina is disrupted. It results from an overgrowth of certain bacteria. This is the most common vaginal infection among women ages 7-44. Because bacterial vaginosis increases your risk for STIs (sexually transmitted infections), getting treated can help reduce your risk for chlamydia, gonorrhea, herpes, and HIV (human immunodeficiency virus). Treatment is also important for preventing complications in pregnant women, because this condition can cause an early (premature) delivery. What are the causes? This condition is caused by an increase in harmful bacteria that are normally present in small amounts in the vagina. However, the reason that the condition develops is not fully understood. What increases the risk? The following factors may make you more likely to develop this condition:  Having a new sexual partner or multiple sexual partners.  Having unprotected sex.  Douching.  Having an intrauterine device (IUD).  Smoking.  Drug and alcohol abuse.  Taking certain antibiotic medicines.  Being pregnant.  You cannot get bacterial vaginosis from toilet seats, bedding, swimming pools, or contact with objects around you. What are the signs or symptoms? Symptoms of this condition include:  Grey or white vaginal discharge. The discharge can also be watery or foamy.  A fish-like odor with discharge, especially after sexual intercourse or during menstruation.  Itching in and around the vagina.  Burning or pain with urination.  Some women with bacterial vaginosis have no signs or symptoms. How is this diagnosed? This condition is diagnosed based on:  Your medical history.  A physical exam of the vagina.  Testing a sample of vaginal fluid under a microscope to look for a large amount of bad bacteria or abnormal cells. Your health care provider may use a cotton swab  or a small wooden spatula to collect the sample.  How is this treated? This condition is treated with antibiotics. These may be given as a pill, a vaginal cream, or a medicine that is put into the vagina (suppository). If the condition comes back after treatment, a second round of antibiotics may be needed. Follow these instructions at home: Medicines  Take over-the-counter and prescription medicines only as told by your health care provider.  Take or use your antibiotic as told by your health care provider. Do not stop taking or using the antibiotic even if you start to feel better. General instructions  If you have a female sexual partner, tell her that you have a vaginal infection. She should see her health care provider and be treated if she has symptoms. If you have a female sexual partner, he does not need treatment.  During treatment: ? Avoid sexual activity until you finish treatment. ? Do not douche. ? Avoid alcohol as directed by your health care provider. ? Avoid breastfeeding as directed by your health care provider.  Drink enough water and fluids to keep your urine clear or pale yellow.  Keep the area around your vagina and rectum clean. ? Wash the area daily with warm water. ? Wipe yourself from front to back after using the toilet.  Keep all follow-up visits as told by your health care provider. This is important. How is this prevented?  Do not douche.  Wash the outside of your vagina with warm water only.  Use protection when having sex. This includes latex condoms and dental dams.  Limit how many sexual partners you have. To help prevent bacterial vaginosis, it is best to have sex with just  one partner (monogamous).  Make sure you and your sexual partner are tested for STIs.  Wear cotton or cotton-lined underwear.  Avoid wearing tight pants and pantyhose, especially during summer.  Limit the amount of alcohol that you drink.  Do not use any products that  contain nicotine or tobacco, such as cigarettes and e-cigarettes. If you need help quitting, ask your health care provider.  Do not use illegal drugs. Where to find more information:  Centers for Disease Control and Prevention: SolutionApps.co.zawww.cdc.gov/std  American Sexual Health Association (ASHA): www.ashastd.org  U.S. Department of Health and Health and safety inspectorHuman Services, Office on Women's Health: ConventionalMedicines.siwww.womenshealth.gov/ or http://www.anderson-williamson.info/https://www.womenshealth.gov/a-z-topics/bacterial-vaginosis Contact a health care provider if:  Your symptoms do not improve, even after treatment.  You have more discharge or pain when urinating.  You have a fever.  You have pain in your abdomen.  You have pain during sex.  You have vaginal bleeding between periods. Summary  Bacterial vaginosis is a vaginal infection that occurs when the normal balance of bacteria in the vagina is disrupted.  Because bacterial vaginosis increases your risk for STIs (sexually transmitted infections), getting treated can help reduce your risk for chlamydia, gonorrhea, herpes, and HIV (human immunodeficiency virus). Treatment is also important for preventing complications in pregnant women, because the condition can cause an early (premature) delivery.  This condition is treated with antibiotic medicines. These may be given as a pill, a vaginal cream, or a medicine that is put into the vagina (suppository). This information is not intended to replace advice given to you by your health care provider. Make sure you discuss any questions you have with your health care provider. Document Released: 11/11/2005 Document Revised: 03/17/2017 Document Reviewed: 07/27/2016 Elsevier Interactive Patient Education  2018 [employer] Inc. Abdominal Pain During Pregnancy Abdominal pain is common in pregnancy. Most of the time, it does not cause harm. There are many causes of abdominal pain. Some causes are more serious than others and sometimes the cause is not known.  Abdominal pain can be a sign that something is very wrong with the pregnancy or the pain may have nothing to do with the pregnancy. Always tell your health care provider if you have any abdominal pain. Follow these instructions at home:  Do not have sex or put anything in your vagina until your symptoms go away completely.  Watch your abdominal pain for any changes.  Get plenty of rest until your pain improves.  Drink enough fluid to keep your urine clear or pale yellow.  Take over-the-counter or prescription medicines only as told by your health care provider.  Keep all follow-up visits as told by your health care provider. This is important. Contact a health care provider if:  You have a fever.  Your pain gets worse or you have cramping.  Your pain continues after resting. Get help right away if:  You are bleeding, leaking fluid, or passing tissue from the vagina.  You have vomiting or diarrhea that does not go away.  You have painful or bloody urination.  You notice a decrease in your baby's movements.  You feel very weak or faint.  You have shortness of breath.  You develop a severe headache with abdominal pain.  You have abnormal vaginal discharge with abdominal pain. This information is not intended to replace advice given to you by your health care provider. Make sure you discuss any questions you have with your health care provider. Document Released: 11/11/2005 Document Revised: 08/22/2016 Document Reviewed: 06/10/2013 Elsevier Interactive Patient Education  2018 [employer].

## 2018-01-27 NOTE — MAU Provider Note (Signed)
History     CSN: [id number]  Arrival date and time: 01/27/18 1312   First Provider Initiated Contact with Patient 01/27/18 1341      Chief Complaint  Patient presents with  . Abdominal Pain   HPI  Anna Choi is a 22 y.o. G1P0 at 162w3d reporting 4 day history of fleeting epigastric and upper abdominal pains only related to movement and coincident with starting a new job which involves lifting. No abdominal pain at present, even with movement. No vaginal bleeding. She also gives 2 day history of malodorous vaginal discharge and vulvar itching Taking Diglegis since 01/21/18 with much improvement in nausea and vomiting. Last vomited 2 days ago. Not taking Reglan. Gonorrhea and chlamydia cultures negative on 01/21/2018  [facility]  Past Medical History:  Diagnosis Date  . Asthma     Past Surgical History:  Procedure Laterality Date  . TONSILLECTOMY    . TYMPANOSTOMY TUBE PLACEMENT      History reviewed. No pertinent family history.  Social History   Tobacco Use  . Smoking status: Never Smoker  . Smokeless tobacco: Never Used  Substance Use Topics  . Alcohol use: No  . Drug use: No    Comment: 4 months ago last time    Allergies: No Known Allergies  Facility-Administered Medications Prior to Admission  Medication Dose Route Frequency Provider Last Rate Last Dose  . ipratropium-albuterol (DUONEB) 0.5-2.5 (3) MG/3ML nebulizer solution 3 mL  3 mL Nebulization Q20 Min PRN Arrie SenateHairston, Mandesia R, FNP       Medications Prior to Admission  Medication Sig Dispense Refill Last Dose  . albuterol (PROVENTIL HFA;VENTOLIN HFA) 108 (90 Base) MCG/ACT inhaler Inhale 2 puffs into the lungs every 4 (four) hours as needed for wheezing or shortness of breath. (Patient not taking: Reported on 01/21/2018) 1 Inhaler 3 Not Taking  . Doxylamine-Pyridoxine 10-10 MG TBEC Take 2 tablets by mouth at bedtime. 60 tablet 0   . metoCLOPramide (REGLAN) 10 MG tablet Take 1 tablet (10 mg total) by  mouth every 6 (six) hours. 30 tablet 0   . Prenatal Vit-Fe Fumarate-FA (MULTIVITAMIN-PRENATAL) 27-0.8 MG TABS tablet Take 1 tablet by mouth daily at 12 noon. 30 each 12   . ranitidine (ZANTAC) 150 MG tablet Take 1 tablet (150 mg total) by mouth 2 (two) times daily. 60 tablet 0     Review of Systems  Constitutional: Negative for chills and fever.  Gastrointestinal: Positive for abdominal pain and nausea.  Genitourinary: Positive for vaginal discharge. Negative for dysuria, frequency, pelvic pain, urgency, vaginal bleeding and vaginal pain.       Malodorous discharge and vulvar pruritis  Neurological: Negative for dizziness.  Psychiatric/Behavioral: Negative for agitation.   Physical Exam   Blood pressure 127/72, pulse (!) 105, temperature 98.7 F (37.1 C), temperature source Oral, resp. rate 18, height 5\' 3"  (1.6 m), weight 194 lb 4 oz (88.1 kg), last menstrual period 10/18/2017, SpO2 99 %.  Physical Exam  Nursing note and vitals reviewed. Constitutional: She is oriented to person, place, and time. She appears well-developed and well-nourished. No distress.  Cardiovascular: Normal rate.  Respiratory: Effort normal.  GI: Soft. There is no tenderness.  DT 155  Genitourinary: Uterus normal. Vaginal discharge found.  Genitourinary Comments: NEFG without erythema Spec: Moderate amount homogenous white discharge, no malodor noted; cervix clean, nulliparous Cervix posterior, long, closed Uterus consistent with 14 week size, nontender   Musculoskeletal: Normal range of motion.  Neurological: She is alert and oriented to  person, place, and time.  Skin: Skin is warm and dry.  Psychiatric: She has a normal mood and affect.    MAU Course  Procedures WP: Significant for presence of clue cells, moderate WBCs, many bacteria UA: Assessment and Plan   1. BV (bacterial vaginosis)   2. Morning sickness   3. Positive urine pregnancy test   4. Intermittent left upper quadrant abdominal pain     Allergies as of 01/27/2018   No Known Allergies     Medication List    TAKE these medications   albuterol 108 (90 Base) MCG/ACT inhaler Commonly known as:  PROVENTIL HFA;VENTOLIN HFA Inhale 2 puffs into the lungs every 4 (four) hours as needed for wheezing or shortness of breath.   Doxylamine-Pyridoxine 10-10 MG Tbec Take 2 tablets by mouth at bedtime.   metoCLOPramide 10 MG tablet Commonly known as:  REGLAN Take 1 tablet (10 mg total) by mouth every 6 (six) hours.   metroNIDAZOLE 500 MG tablet Commonly known as:  FLAGYL Take 1 tablet (500 mg total) by mouth 2 (two) times daily.   multivitamin-prenatal 27-0.8 MG Tabs tablet Take 1 tablet by mouth daily at 12 noon.   ranitidine 150 MG tablet Commonly known as:  ZANTAC Take 1 tablet (150 mg total) by mouth 2 (two) times daily.      Follow-up Information    [facility] Follow up on 03/19/2018.   Specialty:  Obstetrics and Gynecology Contact information: [address] [place] [address] [phone]

## 2018-01-29 LAB — OBSTETRIC PANEL, INCLUDING HIV
ANTIBODY SCREEN: NEGATIVE
BASOS: 0 %
Basophils Absolute: 0 10*3/uL (ref 0.0–0.2)
EOS (ABSOLUTE): 0.3 10*3/uL (ref 0.0–0.4)
EOS: 3 %
HEMATOCRIT: 38.9 % (ref 34.0–46.6)
HEP B S AG: NEGATIVE
HIV SCREEN 4TH GENERATION: NONREACTIVE
Hemoglobin: 12.8 g/dL (ref 11.1–15.9)
Immature Grans (Abs): 0 10*3/uL (ref 0.0–0.1)
Immature Granulocytes: 0 %
LYMPHS ABS: 2.7 10*3/uL (ref 0.7–3.1)
Lymphs: 28 %
MCH: 30.1 pg (ref 26.6–33.0)
MCHC: 32.9 g/dL (ref 31.5–35.7)
MCV: 92 fL (ref 79–97)
MONOCYTES: 8 %
Monocytes Absolute: 0.7 10*3/uL (ref 0.1–0.9)
NEUTROS ABS: 5.7 10*3/uL (ref 1.4–7.0)
Neutrophils: 61 %
Platelets: 307 10*3/uL (ref 150–379)
RBC: 4.25 x10E6/uL (ref 3.77–5.28)
RDW: 14.1 % (ref 12.3–15.4)
RH TYPE: POSITIVE
RPR: NONREACTIVE
RUBELLA: 1.15 {index} (ref >0.99)
WBC: 9.5 10*3/uL (ref 3.4–10.8)

## 2018-01-29 LAB — HEMOGLOBINOPATHY EVALUATION
FERRITIN: 56 ng/mL (ref 15–150)
HGB A2 QUANT: 2.1 % (ref 1.8–3.2)
HGB A: 97.9 % (ref 96.4–98.8)
HGB C: 0 %
HGB F QUANT: 0 % (ref 0.0–2.0)
Hgb S: 0 %
Hgb Solubility: NEGATIVE
Hgb Variant: 0 %

## 2018-01-29 LAB — SMN1 COPY NUMBER ANALYSIS (SMA CARRIER SCREENING)

## 2018-01-29 LAB — CYSTIC FIBROSIS GENE TEST

## 2018-01-30 ENCOUNTER — Encounter: Payer: Self-pay | Admitting: Obstetrics and Gynecology

## 2018-02-03 ENCOUNTER — Encounter: Payer: Self-pay | Admitting: *Deleted

## 2018-02-11 ENCOUNTER — Encounter: Payer: Self-pay | Admitting: Obstetrics and Gynecology

## 2018-02-11 DIAGNOSIS — R87612 Low grade squamous intraepithelial lesion on cytologic smear of cervix (LGSIL): Secondary | ICD-10-CM | POA: Insufficient documentation

## 2018-02-11 DIAGNOSIS — J45909 Unspecified asthma, uncomplicated: Secondary | ICD-10-CM | POA: Insufficient documentation

## 2018-02-27 ENCOUNTER — Other Ambulatory Visit: Payer: Self-pay | Admitting: Obstetrics and Gynecology

## 2018-02-27 ENCOUNTER — Ambulatory Visit (HOSPITAL_COMMUNITY)
Admission: RE | Admit: 2018-02-27 | Discharge: 2018-02-27 | Disposition: A | Discharge status: Home / Self Care | Payer: Medicaid Other | Source: Ambulatory Visit | Attending: Obstetrics and Gynecology | Admitting: Obstetrics and Gynecology

## 2018-02-27 DIAGNOSIS — Z363 Encounter for antenatal screening for malformations: Secondary | ICD-10-CM

## 2018-02-27 DIAGNOSIS — O99210 Obesity complicating pregnancy, unspecified trimester: Secondary | ICD-10-CM

## 2018-02-27 DIAGNOSIS — O99212 Obesity complicating pregnancy, second trimester: Secondary | ICD-10-CM | POA: Insufficient documentation

## 2018-02-27 DIAGNOSIS — Z3A18 18 weeks gestation of pregnancy: Secondary | ICD-10-CM | POA: Diagnosis not present

## 2018-02-27 DIAGNOSIS — Z3402 Encounter for supervision of normal first pregnancy, second trimester: Secondary | ICD-10-CM

## 2018-03-09 ENCOUNTER — Ambulatory Visit (INDEPENDENT_AMBULATORY_CARE_PROVIDER_SITE_OTHER): Payer: Medicaid Other | Admitting: Advanced Practice Midwife

## 2018-03-09 ENCOUNTER — Other Ambulatory Visit (HOSPITAL_COMMUNITY)
Admission: RE | Admit: 2018-03-09 | Discharge: 2018-03-09 | Disposition: A | Discharge status: Home / Self Care | Payer: Medicaid Other | Source: Ambulatory Visit | Attending: Advanced Practice Midwife | Admitting: Advanced Practice Midwife

## 2018-03-09 ENCOUNTER — Encounter: Payer: Self-pay | Admitting: Advanced Practice Midwife

## 2018-03-09 VITALS — BP 116/69 | HR 98 | Wt 189.9 lb

## 2018-03-09 DIAGNOSIS — Z3400 Encounter for supervision of normal first pregnancy, unspecified trimester: Secondary | ICD-10-CM | POA: Insufficient documentation

## 2018-03-09 DIAGNOSIS — B373 Candidiasis of vulva and vagina: Secondary | ICD-10-CM | POA: Diagnosis not present

## 2018-03-09 DIAGNOSIS — Z3402 Encounter for supervision of normal first pregnancy, second trimester: Secondary | ICD-10-CM

## 2018-03-09 DIAGNOSIS — L811 Chloasma: Secondary | ICD-10-CM

## 2018-03-09 DIAGNOSIS — O99719 Diseases of the skin and subcutaneous tissue complicating pregnancy, unspecified trimester: Secondary | ICD-10-CM

## 2018-03-09 DIAGNOSIS — O99712 Diseases of the skin and subcutaneous tissue complicating pregnancy, second trimester: Secondary | ICD-10-CM

## 2018-03-09 NOTE — Patient Instructions (Signed)
Places to have your son circumcised:    [facility] [phone] $[phone] while you are in hospital  [facility] [phone] $244 by 4 wks  [facility] [phone] $[phone] by 2 wks  [facility] [phone] $250 by 7 days [facility] [phone] $269 by 4 wks  These prices sometimes change but are roughly what you can expect to pay. Please call and confirm pricing.   Circumcision is considered an elective/non-medically necessary procedure. There are many reasons parents decide to have their sons circumsized. During the first year of life circumcised males have a reduced risk of urinary tract infections but after this year the rates between circumcised males and uncircumcised males are the same.  It is safe to have your son circumcised outside of the hospital and the places above perform them regularly.   Deciding about Circumcision in Baby Boys  (Up-to-date The Basics)  What is circumcision?  Circumcision is a surgery that removes the skin that covers the tip of the penis, called the "foreskin" Circumcision is usually done when a boy is between 1 and 10 days old. In the [place], circumcision is common. In some other countries, fewer boys are circumcised. Circumcision is a common tradition in some religions.  Should I have my baby boy circumcised?  There is no easy answer. Circumcision has some benefits. But it also has risks. After talking with your doctor, you will have to decide for yourself what is right for your family.  What are the benefits of circumcision?  Circumcised boys seem to have slightly lower rates of: ?Urinary tract infections ?Swelling of the opening at the tip of the penis Circumcised men seem to have slightly lower rates of: ?Urinary tract infections ?Swelling of the opening at the tip of the penis ?Penis  cancer ?HIV and other infections that you catch during sex ?Cervical cancer in the women they have sex with Even so, in the [place], the risks of these problems are small - even in boys and men who have not been circumcised. Plus, boys and men who are not circumcised can reduce these extra risks by: ?Cleaning their penis well ?Using condoms during sex  What are the risks of circumcision?  Risks include: ?Bleeding or infection from the surgery ?Damage to or amputation of the penis ?A chance that the doctor will cut off too much or not enough of the foreskin ?A chance that sex won't feel as good later in life Only about 1 out of every 200 circumcisions leads to problems. There is also a chance that your health insurance won't pay for circumcision.  How is circumcision done in baby boys?  First, the baby gets medicine for pain relief. This might be a cream on the skin or a shot into the base of the penis. Next, the doctor cleans the baby's penis well. Then he or she uses special tools to cut off the foreskin. Finally, the doctor wraps a bandage (called gauze) around the baby's penis. If you have your baby circumcised, his doctor or nurse will give you instructions on how to care for him after the surgery. It is important that you follow those instructions carefully.  AREA PEDIATRIC/FAMILY PRACTICE PHYSICIANS  [facility] [address], [address] [place], [place]  [address] Phone - [phone]   Fax - [phone]  [facility] [address] [place], [place] [address] Phone - [phone]   Fax - [phone]  [facility] [address] [place], [place]    [address] Phone - [phone]   Fax - [phone]  [facility] [address] [place], [place]  [address] Phone - [phone]   Fax - [phone]  [facility] [address] [place], [place]  [address] Phone - [phone]   Fax -  [phone]  [facility] [address] [place], [place]  [address] Phone - [phone]   Fax - [phone]  [facility] [address] [place], [place]  [address] Phone - [phone]   Fax - [phone]  [facility] [address] [place], [place]  [address] Phone - [phone]   Fax - [phone]  [facility] [address] [place], [place]  [address] Phone - [phone]   Fax - [phone] [facility] [address] [place], [place]  [address] Phone - [phone]   Fax - [phone]  [facility] [address] [place], [place]  [address] Phone - [phone]   Fax - [phone]  [facility] [address] [place], [place]  [address] Phone - [phone]   Fax - [phone]  [facility] [address] [place], [place]  [address] Phone - [phone]   Fax - [phone]  [facility] [address] [place], [place]  [address] Phone - [phone]  [facility] [address] [place], [place]  [address] Phone - [phone]   Fax - [phone]  [facility] [address] [place], [place]  [address] Phone - [phone]   Fax - [phone]  [facility] [address] [place], [place]  [address] Phone - [phone]   Fax - [phone]  [facility] [address] [place], [place]  [address] Phone - [phone]   Fax - [phone]   [facility] [address] [place], [place]  [address] Phone - [phone]   Fax - [phone]  [facility] [address] [place], [place]  [address] Phone - [phone]   Fax - [phone]  [facility] R[facility] [address] [place], [place]  [address] Phone - [phone]   Fax - [phone]  [facility] [address] [place], [place]  [address] Phone - [phone]   Fax - [phone]  [facility] [address] , [place]  [address] Phone - [phone]   Fax - [phone] [facility] [address] [place], [place]  [address] Phone - [phone]   Fax - [phone]  [facility] [address] [place], [place]  [address] Phone - [phone]   Fax - [phone]   [facility] [address] [place],   [address] Phone - [phone]   Fax - [phone]  [facility] Charlene Flemming MD [address] [place]  [address] Phone [phone]  Fax [phone]  Childbirth Education Options: [facility] Classes:  Childbirth education classes can help you get ready for a positive parenting experience. You can also meet other expectant parents and get free stuff for your baby. Each class runs for five weeks on the same night and costs $45 for the mother-to-be and her support person. Medicaid covers the cost if you are eligible. Call [phone] to register. [facility] Childbirth Education:  [phone]   or [phone] or [email or web address]  Baby & Me Class: Discuss newborn & infant parenting and family adjustment issues with other new mothers in a relaxed environment. Each week brings a new speaker or baby-centered activity. We encourage new mothers to join us every Thursday at 11:00am. Babies birth until crawling. No registration or fee. Daddy Boot Camp: This course offers Dads-to-be the tools and knowledge needed to feel confident on their journey to becoming new fathers. Experienced dads, who have been trained as coaches, teach dads-to-be how to hold, comfort, diaper, swaddle and play with their infant while  being able to support the new mom as well. A class for men taught by men. $25/dad Big Brother/Big Sister: Let your children share in the joy of a new brother or sister in this special class designed just for them. Class includes discussion about how families care for babies: swaddling, holding, diapering, safety as well as how they can be helpful in their new role. This class is designed for children ages 2 to 6, but any age is welcome. Please register each child individually. $5/child  Mom Talk: This mom-led group offers support and connection to mothers as they journey through the adjustments and struggles of that sometimes overwhelming first year after the birth of a child. Tuesdays at 10:00am and Thursdays at 6:00pm. Babies welcome. No registration or fee. Breastfeeding Support Group: This group is a mother-to-mother support circle where moms have the opportunity to share their breastfeeding experiences. A Lactation Consultant is present for questions and concerns. Meets each Tuesday at 11:00am. No fee or registration. Breastfeeding Your Baby: Learn what to expect in the first days of breastfeeding your newborn.  This class will help you feel more confident with the skills needed to begin your breastfeeding experience. Many new mothers are concerned about breastfeeding after leaving the hospital. This class will also address the most common fears and challenges about breastfeeding during the first few weeks, months and beyond. (call for fee) Comfort Techniques and Tour: This 2 hour interactive class will provide you the opportunity to learn & practice hands-on techniques that can help relieve some of the discomfort of labor and encourage your baby to rotate toward the best position for birth. You and your partner will be able to try a variety of labor positions with birth balls and rebozos as well as practice breathing, relaxation, and visualization techniques. A tour of the [facility]  [facility] is included with this class. $20 per registrant and support person Childbirth Class- Weekend Option: This class is a Weekend version of our Birth & Baby series. It is designed for parents who have a difficult time fitting several weeks of classes into their schedule. It covers the care of your newborn and the basics of labor and childbirth. It also includes a Maternity Care Center Tour of [facility] and lunch. The class is held two consecutive days: beginning on Friday evening from 6:30 - 8:30 p.m. and the next day, Saturday from 9 a.m. - 4 p.m. (call for fee) Waterbirth Class: Interested in a waterbirth?  This informational class will help you discover whether waterbirth is the right fit for you. Education about waterbirth itself, supplies you would need and how to assemble your support team is what you can expect from this class. Some obstetrical practices require this class in order to pursue a waterbirth. (Not all obstetrical practices offer waterbirth-check with your healthcare provider.) Register only the expectant mom, but you are encouraged to bring your partner to   class! Required if planning waterbirth, no fee. Infant/Child CPR: Parents, grandparents, babysitters, and friends learn Cardio-Pulmonary Resuscitation skills for infants and children. You will also learn how to treat both conscious and unconscious choking in infants and children. This Family & Friends program does not offer certification. Register each participant individually to ensure that enough mannequins are available. (Call for fee) Grandparent Love: Expecting a grandbaby? This class is for you! Learn about the latest infant care and safety recommendations and ways to support your own child as he or she transitions into the parenting role. Taught by Registered Nurses who are childbirth instructors, but most importantly...they are grandmothers too! $10/person. Childbirth Class- Natural Childbirth: This series of 5 weekly  classes is for expectant parents who want to learn and practice natural methods of coping with the process of labor and childbirth. Relaxation, breathing, massage, visualization, role of the partner, and helpful positioning are highlighted. Participants learn how to be confident in their body's ability to give birth. This class will empower and help parents make informed decisions about their own care. Includes discussion that will help new parents transition into the immediate postpartum period. Maternity Care Center Tour of [facility] is included. We suggest taking this class between 25-32 weeks, but it's only a recommendation. $75 per registrant and one support person or $30 Medicaid. Childbirth Class- 3 week Series: This option of 3 weekly classes helps you and your labor partner prepare for childbirth. Newborn care, labor & birth, cesarean birth, pain management, and comfort techniques are discussed and a Maternity Care Center Tour of [facility] is included. The class meets at the same time, on the same day of the week for 3 consecutive weeks beginning with the starting date you choose. $60 for registrant and one support person.  Marvelous Multiples: Expecting twins, triplets, or more? This class covers the differences in labor, birth, parenting, and breastfeeding issues that face multiples' parents. NICU tour is included. Led by a Certified Childbirth Educator who is the mother of twins. No fee. Caring for Baby: This class is for expectant and adoptive parents who want to learn and practice the most up-to-date newborn care for their babies. Focus is on birth through the first six weeks of life. Topics include feeding, bathing, diapering, crying, umbilical cord care, circumcision care and safe sleep. Parents learn to recognize symptoms of illness and when to call the pediatrician. Register only the mom-to-be and your partner or support person can plan to come with you! $10 per registrant and  support person Childbirth Class- online option: This online class offers you the freedom to complete a Birth and Baby series in the comfort of your own home. The flexibility of this option allows you to review sections at your own pace, at times convenient to you and your support people. It includes additional video information, animations, quizzes, and extended activities. Get organized with helpful eClass tools, checklists, and trackers. Once you register online for the class, you will receive an email within a few days to accept the invitation and begin the class when the time is right for you. The content will be available to you for 60 days. $60 for 60 days of online access for you and your support people.  Local Doulas: Natural Baby Doulas [email or web address] Tel: [phone] [email or web address] [facility] [phone] [email or web address] The Labor Ladies  (also do waterbirth tub rental) [phone] [email or web address] [email or web address] [phone] [email or web address] Sacred Rhythms  [phone] [email or web address] [place] [facility] [employer] (PADA) [email or web address] [email or web address].org/index.htm [place]   [facility] Birth and Baby  [email or web address] Considering Waterbirth? Guide for patients at [facility] [employer]  Why consider waterbirth?  . Gentle birth for babies . Less pain medicine used in labor . May allow for passive descent/less pushing . May reduce perineal tears  . More mobility and instinctive maternal position changes . Increased maternal relaxation . Reduced blood pressure in labor  Is waterbirth safe? What are the risks of infection, drowning or other complications?  . Infection: o Very low risk (3.7 % for tub vs 4.8% for bed) o 7 in 8000 waterbirths with documented  infection o Poorly cleaned equipment most common cause o Slightly lower group B strep transmission rate  . Drowning o Maternal:  - Very low risk   - Related to seizures or fainting o Newborn:  - Very low risk. No evidence of increased risk of respiratory problems in multiple large studies - Physiological protection from breathing under water - Avoid underwater birth if there are any fetal complications - Once baby's head is out of the water, keep it out.  . Birth complication o Some reports of cord trauma, but risk decreased by bringing baby to surface gradually o No evidence of increased risk of shoulder dystocia. Mothers can usually change positions faster in water than in a bed, possibly aiding the maneuvers to free the shoulder.   You must attend a Doren Custard class at [facility]  3rd Wednesday of every month from 7-9pm  [employer] by calling [phone] or online at [email or web address]  Bring [place] the certificate from the class to your prenatal appointment  Meet with a midwife at 36 weeks to see if you can still plan a waterbirth and to sign the consent.   Purchase or rent the following supplies:   Water Birth Pool (Birth Pool in a Box or [place] for instance)  (Tubs start ~$125)  Single-use disposable tub liner designed for your brand of tub  New garden hose labeled "lead-free", "suitable for drinking water",  Electric drain pump to remove water (We recommend 792 gallon per hour or greater pump.)   Separate garden hose to remove the dirty water  Fish net  Bathing suit top (optional)  Long-handled mirror (optional)  Places to purchase or rent supplies  GotWebTools.is for tub purchases and supplies  Waterbirthsolutions.com for tub purchases and supplies  The Labor Ladies (www.[email or web address]) $275 for tub rental/set-up & take down/kit   [employer] (http://www.fleming.com/.htm) Information regarding doulas  (labor support) who provide pool rentals  Our practice has a Birth Pool in a Box tub at the hospital that you may borrow on a first-come-first-served basis. It is your responsibility to to set up, clean and break down the tub. We cannot guarantee the availability of this tub in advance. You are responsible for bringing all accessories listed above. If you do not have all necessary supplies you cannot have a waterbirth.    Things that would prevent you from having a waterbirth:  Premature, <37wks  Previous cesarean birth  Presence of thick meconium-stained fluid  Multiple gestation (Twins, triplets, etc.)  Uncontrolled diabetes or gestational diabetes requiring medication  Hypertension requiring medication or diagnosis of pre-eclampsia  Heavy vaginal bleeding  Non-reassuring fetal heart rate  Active infection (MRSA, etc.). Group B Strep is NOT a contraindication for  waterbirth.  If your labor has to be induced and induction method requires continuous  monitoring of the baby's heart rate  Other risks/issues identified by your obstetrical provider  Please remember  that birth is unpredictable. Under certain unforeseeable circumstances your provider may advise against giving birth in the tub. These decisions will be made on a case-by-case basis and with the safety of you and your baby as our highest priority.      

## 2018-03-09 NOTE — Progress Notes (Signed)
   PRENATAL VISIT NOTE  Subjective:  Anna Choi is a 22 y.o. G1P0 at 4921w2d being seen today for ongoing prenatal care.  She is currently monitored for the following issues for this low-risk pregnancy and has Morning sickness; Positive urine pregnancy test; Supervision of normal first pregnancy, antepartum; Asthma; Low grade squamous intraepith lesion on cytologic smear cervix (lgsil); and Melasma gravidarum on their problem list.  Patient reports no complaints.   . Vag. Bleeding: None.  Movement: Present. Denies leaking of fluid.  Patient was unable to take flagyl, and would like to have another swab today to check for BV. No sx currently.   The following portions of the patient's history were reviewed and updated as appropriate: allergies, current medications, past family history, past medical history, past social history, past surgical history and problem list. Problem list updated.  Objective:   Vitals:   03/09/18 1025  BP: 116/69  Pulse: 98  Weight: 189 lb 14.4 oz (86.1 kg)    Fetal Status: Fetal Heart Rate (bpm): 145   Movement: Present     General:  Alert, oriented and cooperative. Patient is in no acute distress.  Skin: Skin is warm and dry. No rash noted.   Cardiovascular: Normal heart rate noted  Respiratory: Normal respiratory effort, no problems with respiration noted  Abdomen: Soft, gravid, appropriate for gestational age.  Pain/Pressure: Absent     Pelvic: Cervical exam deferred        Extremities: Normal range of motion.  Edema: Trace  Mental Status: Normal mood and affect. Normal behavior. Normal judgment and thought content.   Assessment and Plan:  Pregnancy: G1P0 at 2521w2d  1. Supervision of normal first pregnancy, antepartum - US MFM OB FOLLOW UP; Future  Preterm labor symptoms and general obstetric precautions including but not limited to vaginal bleeding, contractions, leaking of fluid and fetal movement were reviewed in detail with the  patient. Please refer to After Visit Summary for other counseling recommendations.  Return in about 1 month (around 04/06/2018).  Future Appointments  Date Time Provider Department Center  04/01/2018  8:45 AM [facility] US 2 [facility]    Thressa ShellerHeather Hogan, CNM

## 2018-03-10 LAB — CERVICOVAGINAL ANCILLARY ONLY
Bacterial vaginitis: NEGATIVE
CANDIDA VAGINITIS: POSITIVE — AB
Trichomonas: NEGATIVE

## 2018-03-11 ENCOUNTER — Other Ambulatory Visit: Payer: Self-pay | Admitting: Advanced Practice Midwife

## 2018-03-11 MED ORDER — TERCONAZOLE 0.4 % VA CREA: 1.0000 | TOPICAL_CREAM | Freq: Every day | VAGINAL | 0 refills | Status: DC

## 2018-04-01 ENCOUNTER — Ambulatory Visit (HOSPITAL_COMMUNITY)
Admission: RE | Admit: 2018-04-01 | Discharge: 2018-04-01 | Disposition: A | Discharge status: Home / Self Care | Payer: Medicaid Other | Source: Ambulatory Visit | Attending: Advanced Practice Midwife | Admitting: Advanced Practice Midwife

## 2018-04-01 ENCOUNTER — Other Ambulatory Visit: Payer: Self-pay | Admitting: Advanced Practice Midwife

## 2018-04-01 DIAGNOSIS — Z3A23 23 weeks gestation of pregnancy: Secondary | ICD-10-CM

## 2018-04-01 DIAGNOSIS — Z369 Encounter for antenatal screening, unspecified: Secondary | ICD-10-CM

## 2018-04-01 DIAGNOSIS — Z362 Encounter for other antenatal screening follow-up: Secondary | ICD-10-CM

## 2018-04-01 DIAGNOSIS — Z3400 Encounter for supervision of normal first pregnancy, unspecified trimester: Secondary | ICD-10-CM

## 2018-04-06 ENCOUNTER — Encounter: Payer: Self-pay | Admitting: Certified Nurse Midwife

## 2018-04-06 ENCOUNTER — Ambulatory Visit (INDEPENDENT_AMBULATORY_CARE_PROVIDER_SITE_OTHER): Payer: Medicaid Other | Admitting: Certified Nurse Midwife

## 2018-04-06 DIAGNOSIS — Z3400 Encounter for supervision of normal first pregnancy, unspecified trimester: Secondary | ICD-10-CM

## 2018-04-06 NOTE — Progress Notes (Incomplete)
   PRENATAL VISIT NOTE  Subjective:  Anna Choi is a 22 y.o. G1P0 at [redacted]w[redacted]d being seen today for ongoing prenatal care.  She is currently monitored for the following issues for this {Blank single:19197::"high-risk","low-risk"} pregnancy and has Morning sickness; Positive urine pregnancy test; Supervision of normal first pregnancy, antepartum; Asthma; Low grade squamous intraepith lesion on cytologic smear cervix (lgsil); and Melasma gravidarum on their problem list.  Patient reports {sx:14538}.  Contractions: Not present. Vag. Bleeding: None.  Movement: Present. Denies leaking of fluid.   The following portions of the patient's history were reviewed and updated as appropriate: allergies, current medications, past family history, past medical history, past social history, past surgical history and problem list. Problem list updated.  Objective:   Vitals:   04/06/18 0909  BP: 110/66  Pulse: 82  Weight: 196 lb 9.6 oz (89.2 kg)    Fetal Status: Fetal Heart Rate (bpm): 147 Fundal Height: 25 cm Movement: Present     General:  Alert, oriented and cooperative. Patient is in no acute distress.  Skin: Skin is warm and dry. No rash noted.   Cardiovascular: Normal heart rate noted  Respiratory: Normal respiratory effort, no problems with respiration noted  Abdomen: Soft, gravid, appropriate for gestational age.  Pain/Pressure: Present     Pelvic: {Blank single:19197::"Cervical exam performed","Cervical exam deferred"}        Extremities: Normal range of motion.  Edema: Trace  Mental Status: Normal mood and affect. Normal behavior. Normal judgment and thought content.   Assessment and Plan:  Pregnancy: G1P0 at [redacted]w[redacted]d  1. Supervision of normal first pregnancy, antepartum ***  {Blank single:19197::"Term","Preterm"} labor symptoms and general obstetric precautions including but not limited to vaginal bleeding, contractions, leaking of fluid and fetal movement were reviewed in detail with  the patient. Please refer to After Visit Summary for other counseling recommendations.  Return in about 1 month (around 05/04/2018) for ROB/2hrGTT/3rdTrimesterlabs.  Future Appointments  Date Time Provider Department Center  05/07/2018  8:20 AM [facility]  05/07/2018  8:55 AM Adam Phenix, MD [facility]    Sharyon Cable, CNM

## 2018-04-06 NOTE — Patient Instructions (Addendum)
Second Trimester of Pregnancy The second trimester is from week 13 through week 28, month 4 through 6. This is often the time in pregnancy that you feel your best. Often times, morning sickness has lessened or quit. You may have more energy, and you may get hungry more often. Your unborn baby (fetus) is growing rapidly. At the end of the sixth month, he or she is about 9 inches long and weighs about 1 pounds. You will likely feel the baby move (quickening) between 18 and 20 weeks of pregnancy. Follow these instructions at home:  Avoid all smoking, herbs, and alcohol. Avoid drugs not approved by your doctor.  Do not use any tobacco products, including cigarettes, chewing tobacco, and electronic cigarettes. If you need help quitting, ask your doctor. You may get counseling or other support to help you quit.  Only take medicine as told by your doctor. Some medicines are safe and some are not during pregnancy.  Exercise only as told by your doctor. Stop exercising if you start having cramps.  Eat regular, healthy meals.  Wear a good support bra if your breasts are tender.  Do not use hot tubs, steam rooms, or saunas.  Wear your seat belt when driving.  Avoid raw meat, uncooked cheese, and liter boxes and soil used by cats.  Take your prenatal vitamins.  Take 1500-2000 milligrams of calcium daily starting at the 20th week of pregnancy until you deliver your baby.  Try taking medicine that helps you poop (stool softener) as needed, and if your doctor approves. Eat more fiber by eating fresh fruit, vegetables, and whole grains. Drink enough fluids to keep your pee (urine) clear or pale yellow.  Take warm water baths (sitz baths) to soothe pain or discomfort caused by hemorrhoids. Use hemorrhoid cream if your doctor approves.  If you have puffy, bulging veins (varicose veins), wear support hose. Raise (elevate) your feet for 15 minutes, 3-4 times a day. Limit salt in your diet.  Avoid heavy  lifting, wear low heals, and sit up straight.  Rest with your legs raised if you have leg cramps or low back pain.  Visit your dentist if you have not gone during your pregnancy. Use a soft toothbrush to brush your teeth. Be gentle when you floss.  You can have sex (intercourse) unless your doctor tells you not to.  Go to your doctor visits. Get help if:  You feel dizzy.  You have mild cramps or pressure in your lower belly (abdomen).  You have a nagging pain in your belly area.  You continue to feel sick to your stomach (nauseous), throw up (vomit), or have watery poop (diarrhea).  You have bad smelling fluid coming from your vagina.  You have pain with peeing (urination). Get help right away if:  You have a fever.  You are leaking fluid from your vagina.  You have spotting or bleeding from your vagina.  You have severe belly cramping or pain.  You lose or gain weight rapidly.  You have trouble catching your breath and have chest pain.  You notice sudden or extreme puffiness (swelling) of your face, hands, ankles, feet, or legs.  You have not felt the baby move in over an hour.  You have severe headaches that do not go away with medicine.  You have vision changes. This information is not intended to replace advice given to you by your health care provider. Make sure you discuss any questions you have with your health care   provider. Document Released: 02/05/2010 Document Revised: 04/18/2016 Document Reviewed: 01/12/2013 Elsevier Interactive Patient Education  2017 [employer]  

## 2018-04-06 NOTE — Progress Notes (Signed)
   PRENATAL VISIT NOTE  Subjective:  Anna Choi is a 22 y.o. G1P0 at [redacted]w[redacted]d being seen today for ongoing prenatal care.  She is currently monitored for the following issues for this low-risk pregnancy and has Morning sickness; Positive urine pregnancy test; Supervision of normal first pregnancy, antepartum; Asthma; Low grade squamous intraepith lesion on cytologic smear cervix (lgsil); and Melasma gravidarum on their problem list.  Patient reports no complaints.  Contractions: Not present. Vag. Bleeding: None.  Movement: Present. Denies leaking of fluid.   The following portions of the patient's history were reviewed and updated as appropriate: allergies, current medications, past family history, past medical history, past social history, past surgical history and problem list. Problem list updated.  Objective:   Vitals:   04/06/18 0909  BP: 110/66  Pulse: 82  Weight: 196 lb 9.6 oz (89.2 kg)    Fetal Status: Fetal Heart Rate (bpm): 147 Fundal Height: 25 cm Movement: Present     General:  Alert, oriented and cooperative. Patient is in no acute distress.  Skin: Skin is warm and dry. No rash noted.   Cardiovascular: Normal heart rate noted  Respiratory: Normal respiratory effort, no problems with respiration noted  Abdomen: Soft, gravid, appropriate for gestational age.  Pain/Pressure: Present     Pelvic: Cervical exam deferred        Extremities: Normal range of motion.  Edema: Trace  Mental Status: Normal mood and affect. Normal behavior. Normal judgment and thought content.   Assessment and Plan:  Pregnancy: G1P0 at [redacted]w[redacted]d  1. Supervision of normal first pregnancy, antepartum -Patient doing well, no complaints  -Anticipatory guidance on upcoming appointment with GTT. Discussed need to come to appointment fasting for next appointment. Patient verbalizes understanding.   Preterm labor symptoms and general obstetric precautions including but not limited to vaginal bleeding,  contractions, leaking of fluid and fetal movement were reviewed in detail with the patient. Please refer to After Visit Summary for other counseling recommendations.  Return in about 1 month (around 05/04/2018) for ROB/2hrGTT/3rdTrimesterlabs.  Future Appointments  Date Time Provider Department Center  05/07/2018  8:20 AM [facility]  05/07/2018  8:55 AM Adam Phenix, MD [facility]    Sharyon Cable, CNM

## 2018-05-07 ENCOUNTER — Ambulatory Visit (INDEPENDENT_AMBULATORY_CARE_PROVIDER_SITE_OTHER): Payer: Medicaid Other | Admitting: Obstetrics & Gynecology

## 2018-05-07 ENCOUNTER — Other Ambulatory Visit: Payer: Self-pay

## 2018-05-07 ENCOUNTER — Other Ambulatory Visit: Payer: Medicaid Other

## 2018-05-07 VITALS — BP 120/64 | HR 89 | Wt 204.2 lb

## 2018-05-07 DIAGNOSIS — Z3400 Encounter for supervision of normal first pregnancy, unspecified trimester: Secondary | ICD-10-CM

## 2018-05-07 DIAGNOSIS — K219 Gastro-esophageal reflux disease without esophagitis: Secondary | ICD-10-CM

## 2018-05-07 DIAGNOSIS — Z3403 Encounter for supervision of normal first pregnancy, third trimester: Secondary | ICD-10-CM

## 2018-05-07 MED ORDER — PANTOPRAZOLE SODIUM 20 MG PO TBEC: 20.0000 mg | DELAYED_RELEASE_TABLET | Freq: Every day | ORAL | 1 refills | Status: DC

## 2018-05-07 NOTE — Patient Instructions (Signed)

## 2018-05-07 NOTE — Progress Notes (Signed)
   PRENATAL VISIT NOTE  Subjective:  Anna Choi is a 22 y.o. G1P0 at 6524w5d being seen today for ongoing prenatal care.  She is currently monitored for the following issues for this low-risk pregnancy and has Morning sickness; Positive urine pregnancy test; Supervision of normal first pregnancy, antepartum; Asthma; Low grade squamous intraepith lesion on cytologic smear cervix (lgsil); and Melasma gravidarum on their problem list.  Patient reports heartburn and nausea.  Contractions: Not present. Vag. Bleeding: None.  Movement: Present. Denies leaking of fluid.   The following portions of the patient's history were reviewed and updated as appropriate: allergies, current medications, past family history, past medical history, past social history, past surgical history and problem list. Problem list updated.  Objective:   Vitals:   05/07/18 0902  BP: 120/64  Pulse: 89  Weight: 204 lb 3.2 oz (92.6 kg)    Fetal Status: Fetal Heart Rate (bpm): 138   Movement: Present     General:  Alert, oriented and cooperative. Patient is in no acute distress.  Skin: Skin is warm and dry. No rash noted.   Cardiovascular: Normal heart rate noted  Respiratory: Normal respiratory effort, no problems with respiration noted  Abdomen: Soft, gravid, appropriate for gestational age.  Pain/Pressure: Absent     Pelvic: Cervical exam deferred        Extremities: Normal range of motion.  Edema: Trace  Mental Status: Normal mood and affect. Normal behavior. Normal judgment and thought content.   Assessment and Plan:  Pregnancy: G1P0 at 5424w5d  1. Supervision of normal first pregnancy, antepartum  - 3rd trimester labs, 2hr GTT  2. Heartburn  - protonix 20mg   Preterm labor symptoms and general obstetric precautions including but not limited to vaginal bleeding, contractions, leaking of fluid and fetal movement were reviewed in detail with the patient. Please refer to After Visit Summary for other  counseling recommendations.  Return in about 2 weeks (around 05/21/2018).  No future appointments.  Alfonso EllisNga T Melea Prezioso, Medical Student

## 2018-05-08 LAB — CBC
Hematocrit: 35.8 % (ref 34.0–46.6)
Hemoglobin: 12.2 g/dL (ref 11.1–15.9)
MCH: 30.6 pg (ref 26.6–33.0)
MCHC: 34.1 g/dL (ref 31.5–35.7)
MCV: 90 fL (ref 79–97)
Platelets: 253 10*3/uL (ref 150–450)
RBC: 3.99 x10E6/uL (ref 3.77–5.28)
RDW: 13.2 % (ref 12.3–15.4)
WBC: 10.3 10*3/uL (ref 3.4–10.8)

## 2018-05-08 LAB — GLUCOSE TOLERANCE, 2 HOURS W/ 1HR
Glucose, 1 hour: 106 mg/dL (ref 65–179)
Glucose, 2 hour: 102 mg/dL (ref 65–152)
Glucose, Fasting: 79 mg/dL (ref 65–91)

## 2018-05-08 LAB — RPR: RPR Ser Ql: NONREACTIVE

## 2018-05-08 LAB — HIV ANTIBODY (ROUTINE TESTING W REFLEX): HIV Screen 4th Generation wRfx: NONREACTIVE

## 2018-05-26 ENCOUNTER — Ambulatory Visit (INDEPENDENT_AMBULATORY_CARE_PROVIDER_SITE_OTHER): Payer: Medicaid Other | Admitting: Advanced Practice Midwife

## 2018-05-26 DIAGNOSIS — Z3400 Encounter for supervision of normal first pregnancy, unspecified trimester: Secondary | ICD-10-CM

## 2018-05-26 NOTE — Progress Notes (Signed)
   PRENATAL VISIT NOTE  Subjective:  Anna Choi is a 22 y.o. G1P0 at 5560w3d being seen today for ongoing prenatal care.  She is currently monitored for the following issues for this low-risk pregnancy and has Morning sickness; Positive urine pregnancy test; Supervision of normal first pregnancy, antepartum; Asthma; Low grade squamous intraepith lesion on cytologic smear cervix (lgsil); Melasma gravidarum; and GERD (gastroesophageal reflux disease) on their problem list.  Patient reports no complaints.  Contractions: Not present. Vag. Bleeding: None.  Movement: Present. Denies leaking of fluid.   The following portions of the patient's history were reviewed and updated as appropriate: allergies, current medications, past family history, past medical history, past social history, past surgical history and problem list. Problem list updated.  Objective:   Vitals:   05/26/18 1038  BP: 109/75  Pulse: 99  Weight: 205 lb 8 oz (93.2 kg)    Fetal Status: Fetal Heart Rate (bpm): 148 Fundal Height: 31 cm Movement: Present     General:  Alert, oriented and cooperative. Patient is in no acute distress.  Skin: Skin is warm and dry. No rash noted.   Cardiovascular: Normal heart rate noted  Respiratory: Normal respiratory effort, no problems with respiration noted  Abdomen: Soft, gravid, appropriate for gestational age.  Pain/Pressure: Absent     Pelvic: Cervical exam deferred        Extremities: Normal range of motion.  Edema: Trace  Mental Status: Normal mood and affect. Normal behavior. Normal judgment and thought content.   Assessment and Plan:  Pregnancy: G1P0 at 5960w3d  1. Supervision of normal first pregnancy, antepartum --Anticipatory guidance about next visits/weeks of pregnancy given.  Preterm labor symptoms and general obstetric precautions including but not limited to vaginal bleeding, contractions, leaking of fluid and fetal movement were reviewed in detail with the  patient. Please refer to After Visit Summary for other counseling recommendations.  Return in about 2 weeks (around 06/09/2018).  Future Appointments  Date Time Provider Department Center  06/10/2018  8:15 AM Degele, Kandra NicolasJulie P, MD [facility]  06/24/2018  8:15 AM Judeth HornLawrence, Erin, NP [facility]    Sharen CounterLisa Leftwich-Kirby, CNM

## 2018-05-26 NOTE — Patient Instructions (Signed)
Third Trimester of Pregnancy The third trimester is from week 28 through week 40 (months 7 through 9). The third trimester is a time when the unborn baby (fetus) is growing rapidly. At the end of the ninth month, the fetus is about 20 inches in length and weighs 6-10 pounds. Body changes during your third trimester Your body will continue to go through many changes during pregnancy. The changes vary from woman to woman. During the third trimester:  Your weight will continue to increase. You can expect to gain 25-35 pounds (11-16 kg) by the end of the pregnancy.  You may begin to get stretch marks on your hips, abdomen, and breasts.  You may urinate more often because the fetus is moving lower into your pelvis and pressing on your bladder.  You may develop or continue to have heartburn. This is caused by increased hormones that slow down muscles in the digestive tract.  You may develop or continue to have constipation because increased hormones slow digestion and cause the muscles that push waste through your intestines to relax.  You may develop hemorrhoids. These are swollen veins (varicose veins) in the rectum that can itch or be painful.  You may develop swollen, bulging veins (varicose veins) in your legs.  You may have increased body aches in the pelvis, back, or thighs. This is due to weight gain and increased hormones that are relaxing your joints.  You may have changes in your hair. These can include thickening of your hair, rapid growth, and changes in texture. Some women also have hair loss during or after pregnancy, or hair that feels dry or thin. Your hair will most likely return to normal after your baby is born.  Your breasts will continue to grow and they will continue to become tender. A yellow fluid (colostrum) may leak from your breasts. This is the first milk you are producing for your baby.  Your belly button may stick out.  You may notice more swelling in your hands,  face, or ankles.  You may have increased tingling or numbness in your hands, arms, and legs. The skin on your belly may also feel numb.  You may feel short of breath because of your expanding uterus.  You may have more problems sleeping. This can be caused by the size of your belly, increased need to urinate, and an increase in your body's metabolism.  You may notice the fetus "dropping," or moving lower in your abdomen (lightening).  You may have increased vaginal discharge.  You may notice your joints feel loose and you may have pain around your pelvic bone.  What to expect at prenatal visits You will have prenatal exams every 2 weeks until week 36. Then you will have weekly prenatal exams. During a routine prenatal visit:  You will be weighed to make sure you and the baby are growing normally.  Your blood pressure will be taken.  Your abdomen will be measured to track your baby's growth.  The fetal heartbeat will be listened to.  Any test results from the previous visit will be discussed.  You may have a cervical check near your due date to see if your cervix has softened or thinned (effaced).  You will be tested for Group B streptococcus. This happens between 35 and 37 weeks.  Your health care provider may ask you:  What your birth plan is.  How you are feeling.  If you are feeling the baby move.  If you have had   any abnormal symptoms, such as leaking fluid, bleeding, severe headaches, or abdominal cramping.  If you are using any tobacco products, including cigarettes, chewing tobacco, and electronic cigarettes.  If you have any questions.  Other tests or screenings that may be performed during your third trimester include:  Blood tests that check for low iron levels (anemia).  Fetal testing to check the health, activity level, and growth of the fetus. Testing is done if you have certain medical conditions or if there are problems during the  pregnancy.  Nonstress test (NST). This test checks the health of your baby to make sure there are no signs of problems, such as the baby not getting enough oxygen. During this test, a belt is placed around your belly. The baby is made to move, and its heart rate is monitored during movement.  What is false labor? False labor is a condition in which you feel small, irregular tightenings of the muscles in the womb (contractions) that usually go away with rest, changing position, or drinking water. These are called Braxton Hicks contractions. Contractions may last for hours, days, or even weeks before true labor sets in. If contractions come at regular intervals, become more frequent, increase in intensity, or become painful, you should see your health care provider. What are the signs of labor?  Abdominal cramps.  Regular contractions that start at 10 minutes apart and become stronger and more frequent with time.  Contractions that start on the top of the uterus and spread down to the lower abdomen and back.  Increased pelvic pressure and dull back pain.  A watery or bloody mucus discharge that comes from the vagina.  Leaking of amniotic fluid. This is also known as your "water breaking." It could be a slow trickle or a gush. Let your health care provider know if it has a color or strange odor. If you have any of these signs, call your health care provider right away, even if it is before your due date. Follow these instructions at home: Medicines  Follow your health care provider's instructions regarding medicine use. Specific medicines may be either safe or unsafe to take during pregnancy.  Take a prenatal vitamin that contains at least 600 micrograms (mcg) of folic acid.  If you develop constipation, try taking a stool softener if your health care provider approves. Eating and drinking  Eat a balanced diet that includes fresh fruits and vegetables, whole grains, good sources of protein  such as meat, eggs, or tofu, and low-fat dairy. Your health care provider will help you determine the amount of weight gain that is right for you.  Avoid raw meat and uncooked cheese. These carry germs that can cause birth defects in the baby.  If you have low calcium intake from food, talk to your health care provider about whether you should take a daily calcium supplement.  Eat four or five small meals rather than three large meals a day.  Limit foods that are high in fat and processed sugars, such as fried and sweet foods.  To prevent constipation: ? Drink enough fluid to keep your urine clear or pale yellow. ? Eat foods that are high in fiber, such as fresh fruits and vegetables, whole grains, and beans. Activity  Exercise only as directed by your health care provider. Most women can continue their usual exercise routine during pregnancy. Try to exercise for 30 minutes at least 5 days a week. Stop exercising if you experience uterine contractions.  Avoid heavy   lifting.  Do not exercise in extreme heat or humidity, or at high altitudes.  Wear low-heel, comfortable shoes.  Practice good posture.  You may continue to have sex unless your health care provider tells you otherwise. Relieving pain and discomfort  Take frequent breaks and rest with your legs elevated if you have leg cramps or low back pain.  Take warm sitz baths to soothe any pain or discomfort caused by hemorrhoids. Use hemorrhoid cream if your health care provider approves.  Wear a good support bra to prevent discomfort from breast tenderness.  If you develop varicose veins: ? Wear support pantyhose or compression stockings as told by your healthcare provider. ? Elevate your feet for 15 minutes, 3-4 times a day. Prenatal care  Write down your questions. Take them to your prenatal visits.  Keep all your prenatal visits as told by your health care provider. This is important. Safety  Wear your seat belt at  all times when driving.  Make a list of emergency phone numbers, including numbers for family, friends, the hospital, and police and fire departments. General instructions  Avoid cat litter boxes and soil used by cats. These carry germs that can cause birth defects in the baby. If you have a cat, ask someone to clean the litter box for you.  Do not travel far distances unless it is absolutely necessary and only with the approval of your health care provider.  Do not use hot tubs, steam rooms, or saunas.  Do not drink alcohol.  Do not use any products that contain nicotine or tobacco, such as cigarettes and e-cigarettes. If you need help quitting, ask your health care provider.  Do not use any medicinal herbs or unprescribed drugs. These chemicals affect the formation and growth of the baby.  Do not douche or use tampons or scented sanitary pads.  Do not cross your legs for long periods of time.  To prepare for the arrival of your baby: ? Take prenatal classes to understand, practice, and ask questions about labor and delivery. ? Make a trial run to the hospital. ? Visit the hospital and tour the maternity area. ? Arrange for maternity or paternity leave through employers. ? Arrange for family and friends to take care of pets while you are in the hospital. ? Purchase a rear-facing car seat and make sure you know how to install it in your car. ? Pack your hospital bag. ? Prepare the baby's nursery. Make sure to remove all pillows and stuffed animals from the baby's crib to prevent suffocation.  Visit your dentist if you have not gone during your pregnancy. Use a soft toothbrush to brush your teeth and be gentle when you floss. Contact a health care provider if:  You are unsure if you are in labor or if your water has broken.  You become dizzy.  You have mild pelvic cramps, pelvic pressure, or nagging pain in your abdominal area.  You have lower back pain.  You have persistent  nausea, vomiting, or diarrhea.  You have an unusual or bad smelling vaginal discharge.  You have pain when you urinate. Get help right away if:  Your water breaks before 37 weeks.  You have regular contractions less than 5 minutes apart before 37 weeks.  You have a fever.  You are leaking fluid from your vagina.  You have spotting or bleeding from your vagina.  You have severe abdominal pain or cramping.  You have rapid weight loss or weight gain.    You have shortness of breath with chest pain.  You notice sudden or extreme swelling of your face, hands, ankles, feet, or legs.  Your baby makes fewer than 10 movements in 2 hours.  You have severe headaches that do not go away when you take medicine.  You have vision changes. Summary  The third trimester is from week 28 through week 40, months 7 through 9. The third trimester is a time when the unborn baby (fetus) is growing rapidly.  During the third trimester, your discomfort may increase as you and your baby continue to gain weight. You may have abdominal, leg, and back pain, sleeping problems, and an increased need to urinate.  During the third trimester your breasts will keep growing and they will continue to become tender. A yellow fluid (colostrum) may leak from your breasts. This is the first milk you are producing for your baby.  False labor is a condition in which you feel small, irregular tightenings of the muscles in the womb (contractions) that eventually go away. These are called Braxton Hicks contractions. Contractions may last for hours, days, or even weeks before true labor sets in.  Signs of labor can include: abdominal cramps; regular contractions that start at 10 minutes apart and become stronger and more frequent with time; watery or bloody mucus discharge that comes from the vagina; increased pelvic pressure and dull back pain; and leaking of amniotic fluid. This information is not intended to replace advice  given to you by your health care provider. Make sure you discuss any questions you have with your health care provider. Document Released: 11/05/2001 Document Revised: 04/18/2016 Document Reviewed: 01/12/2013 Elsevier Interactive Patient Education  2017 [employer]vier Inc.  

## 2018-06-10 ENCOUNTER — Ambulatory Visit (INDEPENDENT_AMBULATORY_CARE_PROVIDER_SITE_OTHER): Payer: Medicaid Other | Admitting: Family Medicine

## 2018-06-10 VITALS — BP 115/69 | HR 96 | Wt 210.3 lb

## 2018-06-10 DIAGNOSIS — Z3403 Encounter for supervision of normal first pregnancy, third trimester: Secondary | ICD-10-CM

## 2018-06-10 DIAGNOSIS — Z3400 Encounter for supervision of normal first pregnancy, unspecified trimester: Secondary | ICD-10-CM

## 2018-06-10 NOTE — Progress Notes (Signed)
   PRENATAL VISIT NOTE  Subjective:  Anna Choi is a 22 y.o. G1P0 at 6263w4d being seen today for ongoing prenatal care.  She is currently monitored for the following issues for this low-risk pregnancy and has Morning sickness; Positive urine pregnancy test; Supervision of normal first pregnancy, antepartum; Asthma; Low grade squamous intraepith lesion on cytologic smear cervix (lgsil); Melasma gravidarum; and GERD (gastroesophageal reflux disease) on their problem list.  Patient reports no complaints.  Contractions: Not present. Vag. Bleeding: None.  Movement: Present. Denies leaking of fluid.   The following portions of the patient's history were reviewed and updated as appropriate: allergies, current medications, past family history, past medical history, past social history, past surgical history and problem list. Problem list updated.  Objective:   Vitals:   06/10/18 0831  BP: 115/69  Pulse: 96  Weight: 210 lb 4.8 oz (95.4 kg)    Fetal Status: Fetal Heart Rate (bpm): 157 Fundal Height: 33 cm Movement: Present     General:  Alert, oriented and cooperative. Patient is in no acute distress.  Skin: Skin is warm and dry. No rash noted.   Cardiovascular: Normal heart rate noted  Respiratory: Normal respiratory effort, no problems with respiration noted  Abdomen: Soft, gravid, appropriate for gestational age.  Pain/Pressure: Present     Pelvic: Cervical exam deferred        Extremities: Normal range of motion.  Edema: Trace  Mental Status: Normal mood and affect. Normal behavior. Normal judgment and thought content.   Assessment and Plan:  Pregnancy: G1P0 at 3463w4d  1. Supervision of normal first pregnancy, antepartum - [facility] - List of pediatricians given - F/u in 2 weeks  Preterm labor symptoms and general obstetric precautions including but not limited to vaginal bleeding, contractions, leaking of fluid and fetal movement were reviewed in detail with the  patient. Please refer to After Visit Summary for other counseling recommendations.  Return in about 2 weeks (around 06/24/2018) for LOB.  Future Appointments  Date Time Provider Department Center  06/24/2018  8:15 AM Judeth HornLawrence, Erin, NP [facility]  07/01/2018  8:15 AM Raelyn Moraawson, Rolitta, CNM [facility]  07/07/2018  8:15 AM Judeth HornLawrence, Erin, NP [facility]  07/15/2018  8:15 AM Raelyn Moraawson, Rolitta, CNM [facility]  07/22/2018  8:15 AM Judeth HornLawrence, Erin, NP [facility]    Frederik PearJulie P Shallen Luedke, MD

## 2018-06-10 NOTE — Patient Instructions (Addendum)
Rooming In Your baby will stay in your room with you for the entire time you are in the hospital. This helps with the following: . Allows Mom to learn baby's feeding cues - Fluttering eyes - Sucking on tongue or hand - Rooting (opens mouth and turns head) - Nuzzling into the breast - Bringing hand to mouth . Allows breastfeeding on demand (when your baby is ready) . Helps baby to be calm and content . Ensures a good milk supply . Prevents complications with breastfeeding . Allows parents to learn to care for baby . Allows you to request assistance with breastfeeding  AREA PEDIATRIC/FAMILY PRACTICE PHYSICIANS  [facility] [address] [place], [place]  [address] Phone - [phone]   Fax - [phone]  [facility] [address] [place], [place] [address] Phone - [phone]   Fax - [phone]  [facility] [address] [place], [place]  [address] Phone - [phone]   Fax - [phone]  [facility] [address] [place], [place]  [address] Phone - [phone]   Fax - [phone]  [facility] [place] [address] [place], [place]  [address] Phone - [phone]   Fax - [phone]  [facility] [address] [place], [place]  [address] Phone - [phone]   Fax - [phone]  [facility] [place] [address] [place], [place]  [address] Phone - [phone]   Fax - [phone]  [facility] AT [facility] [address] [place], [address] [place], [place]  [address] Phone - [phone]   Fax - [phone]  [facility] AT [facility] [address] [place], [place]  [address] Phone - [phone]   Fax - [phone] [facility] [place] [address] [place], [place]  [address] Phone - [phone]   Fax - [phone]  [facility] [address] [place], [place]  [address] Phone - [phone]   Fax - [phone]  [facility] [place] [address], [address] [place], [place]  [address] Phone - [phone]   Fax - [phone]  [facility] [place] [address] [place], [place]  [address] Phone - [phone]   Fax - [phone]  [facility] [address] [place], [place]  [address] Phone - [phone]  [facility] [address] [place], [place]  [address] Phone - [phone]   Fax - [phone]  [facility] [address] [place], [place]  [address] Phone - [phone]   Fax - [phone]  [facility] [address] [place], [place]  [address] Phone - [phone]   Fax - [phone]  [facility] [address] [place], [place]  [address] Phone - [phone]   Fax - [phone]   [facility] [address] [place], [place]  [address] Phone - [phone]   Fax - [phone]  [facility] [address] [place], [place]  [address] Phone - [phone]   Fax - [phone]  DAVID RUBIN [address] [place], [place]  [address] Phone - [phone]   Fax - [phone]  [facility] [address] [place], [place]  [address] Phone - [phone]   Fax - [phone]  [facility] [address] [place], [place]  [address] Phone - [phone]  Fax - [phone] [facility] [place] [id number] [place], [place]  [address] Phone - [phone]   Fax - [phone]  [facility] [address] [place], [place]  [address] Phone - [phone]   Fax - [phone]  [facility] -  [address] [place], [place]  [address] Phone - [phone]   Fax - [phone]  [facility] [place] Wyvonne Lenzharlene Flemming  MD [address] [place] [address] Phone [phone]  Fax [phone]

## 2018-06-24 ENCOUNTER — Encounter: Payer: Medicaid Other | Admitting: Student

## 2018-07-01 ENCOUNTER — Ambulatory Visit (INDEPENDENT_AMBULATORY_CARE_PROVIDER_SITE_OTHER): Payer: Medicaid Other | Admitting: Obstetrics and Gynecology

## 2018-07-01 ENCOUNTER — Encounter: Payer: Self-pay | Admitting: Obstetrics and Gynecology

## 2018-07-01 ENCOUNTER — Other Ambulatory Visit (HOSPITAL_COMMUNITY)
Admission: RE | Admit: 2018-07-01 | Discharge: 2018-07-01 | Disposition: A | Discharge status: Home / Self Care | Payer: Medicaid Other | Source: Ambulatory Visit | Attending: Obstetrics and Gynecology | Admitting: Obstetrics and Gynecology

## 2018-07-01 VITALS — BP 110/74 | HR 98 | Wt 216.4 lb

## 2018-07-01 DIAGNOSIS — N898 Other specified noninflammatory disorders of vagina: Secondary | ICD-10-CM

## 2018-07-01 DIAGNOSIS — Z3A36 36 weeks gestation of pregnancy: Secondary | ICD-10-CM | POA: Insufficient documentation

## 2018-07-01 DIAGNOSIS — Z3400 Encounter for supervision of normal first pregnancy, unspecified trimester: Secondary | ICD-10-CM

## 2018-07-01 DIAGNOSIS — Z3403 Encounter for supervision of normal first pregnancy, third trimester: Secondary | ICD-10-CM | POA: Diagnosis present

## 2018-07-01 DIAGNOSIS — O26893 Other specified pregnancy related conditions, third trimester: Secondary | ICD-10-CM

## 2018-07-01 NOTE — Patient Instructions (Signed)
Braxton Hicks Contractions Contractions of the uterus can occur throughout pregnancy, but they are not always a sign that you are in labor. You may have practice contractions called Braxton Hicks contractions. These false labor contractions are sometimes confused with true labor. What are Braxton Hicks contractions? Braxton Hicks contractions are tightening movements that occur in the muscles of the uterus before labor. Unlike true labor contractions, these contractions do not result in opening (dilation) and thinning of the cervix. Toward the end of pregnancy (32-34 weeks), Braxton Hicks contractions can happen more often and may become stronger. These contractions are sometimes difficult to tell apart from true labor because they can be very uncomfortable. You should not feel embarrassed if you go to the hospital with false labor. Sometimes, the only way to tell if you are in true labor is for your health care provider to look for changes in the cervix. The health care provider will do a physical exam and may monitor your contractions. If you are not in true labor, the exam should show that your cervix is not dilating and your water has not broken. If there are other health problems associated with your pregnancy, it is completely safe for you to be sent home with false labor. You may continue to have Braxton Hicks contractions until you go into true labor. How to tell the difference between true labor and false labor True labor  Contractions last 30-70 seconds.  Contractions become very regular.  Discomfort is usually felt in the top of the uterus, and it spreads to the lower abdomen and low back.  Contractions do not go away with walking.  Contractions usually become more intense and increase in frequency.  The cervix dilates and gets thinner. False labor  Contractions are usually shorter and not as strong as true labor contractions.  Contractions are usually irregular.  Contractions  are often felt in the front of the lower abdomen and in the groin.  Contractions may go away when you walk around or change positions while lying down.  Contractions get weaker and are shorter-lasting as time goes on.  The cervix usually does not dilate or become thin. Follow these instructions at home:  Take over-the-counter and prescription medicines only as told by your health care provider.  Keep up with your usual exercises and follow other instructions from your health care provider.  Eat and drink lightly if you think you are going into labor.  If Braxton Hicks contractions are making you uncomfortable: ? Change your position from lying down or resting to walking, or change from walking to resting. ? Sit and rest in a tub of warm water. ? Drink enough fluid to keep your urine pale yellow. Dehydration may cause these contractions. ? Do slow and deep breathing several times an hour.  Keep all follow-up prenatal visits as told by your health care provider. This is important. Contact a health care provider if:  You have a fever.  You have continuous pain in your abdomen. Get help right away if:  Your contractions become stronger, more regular, and closer together.  You have fluid leaking or gushing from your vagina.  You pass blood-tinged mucus (bloody show).  You have bleeding from your vagina.  You have low back pain that you never had before.  You feel your baby's head pushing down and causing pelvic pressure.  Your baby is not moving inside you as much as it used to. Summary  Contractions that occur before labor are called Braxton   Hicks contractions, false labor, or practice contractions.  Braxton Hicks contractions are usually shorter, weaker, farther apart, and less regular than true labor contractions. True labor contractions usually become progressively stronger and regular and they become more frequent.  Manage discomfort from [facility] contractions by  changing position, resting in a warm bath, drinking plenty of water, or practicing deep breathing. This information is not intended to replace advice given to you by your health care provider. Make sure you discuss any questions you have with your health care provider. Document Released: 03/27/2017 Document Revised: 03/27/2017 Document Reviewed: 03/27/2017 Elsevier Interactive Patient Education  2018 [employer] Hormonal Contraception Information Hormonal contraception is a type of birth control that uses hormones to prevent pregnancy. It usually involves a combination of the hormones estrogen and progesterone or only the hormone progesterone. Hormonal contraception works in these ways:  It thickens the mucus in the cervix, making it harder for sperm to enter the uterus.  It changes the lining of the uterus, making it harder for an egg to implant.  It may stop the ovaries from releasing eggs (ovulation). Some women who take hormonal contraceptives that contain only progesterone may continue to ovulate.  Hormonal contraception cannot prevent sexually transmitted infections (STIs). Pregnancy may still occur. Estrogen and progesterone contraceptives Contraceptives that use a combination of estrogen and progesterone are available in these forms:  Pill. Pills come in different combinations of hormones. They must be taken at the same time each day. Pills can affect your period, causing you to get your period once every three months or not at all.  Patch. The patch must be worn on the lower abdomen for three weeks and then removed on the fourth.  Vaginal ring. The ring is placed in the vagina and left there for three weeks. It is then removed for one week.  Progesterone contraceptives Contraceptives that use progesterone only are available in these forms:  Pill. Pills should be taken every day of the cycle.  Intrauterine device (IUD). This device is inserted into the uterus and removed or  replaced every five years or sooner.  Implant. Plastic rods are placed under the skin of the upper arm. They are removed or replaced every three years or sooner.  Injection. The injection is given once every 90 days.  What are the side effects? The side effects of estrogen and progesterone contraceptives include:  Nausea.  Headaches.  Breast tenderness.  Bleeding or spotting between menstrual cycles.  High blood pressure (rare).  Strokes, heart attacks, or blood clots (rare)  Side effects of progesterone-only contraceptives include:  Nausea.  Headaches.  Breast tenderness.  Unpredictable menstrual bleeding.  High blood pressure (rare).  Talk to your health care provider about what side effects may affect you. Where to find more information:  Ask your health care provider for more information and resources about hormonal contraception.  U.S. Department of Health and [employer] Services Office on Women's Health: http://hoffman.com/www.womenshealth.gov Questions to ask:  What type of hormonal contraception is right for me?  How long should I plan to use hormonal contraception?  What are the side effects of the hormonal contraception method I choose?  How can I prevent STIs while using hormonal contraception? Contact a health care provider if:  You start taking hormonal contraceptives and you develop persistent or severe side effects. Summary  Estrogen and progesterone are hormones used in many forms of birth control.  Talk to your health care provider about what side effects may affect you.  Hormonal contraception cannot prevent sexually transmitted infections (STIs).  Ask your health care provider for more information and resources about hormonal contraception. This information is not intended to replace advice given to you by your health care provider. Make sure you discuss any questions you have with your health care provider. Document Released: 12/01/2007 Document Revised:  10/11/2016 Document Reviewed: 10/11/2016 Elsevier Interactive Patient Education  [employer].

## 2018-07-01 NOTE — Progress Notes (Signed)
   PRENATAL VISIT NOTE  Subjective:  Anna Choi is a 22 y.o. G1P0 at 6560w4d being seen today for ongoing prenatal care.  She is currently monitored for the following issues for this low-risk pregnancy and has Morning sickness; Positive urine pregnancy test; Supervision of normal first pregnancy, antepartum; Asthma; Low grade squamous intraepith lesion on cytologic smear cervix (lgsil); Melasma gravidarum; and GERD (gastroesophageal reflux disease) on their problem list.  Patient reports no complaints.  Contractions: Not present. Vag. Bleeding: None.  Movement: Present. Denies leaking of fluid.   The following portions of the patient's history were reviewed and updated as appropriate: allergies, current medications, past family history, past medical history, past social history, past surgical history and problem list. Problem list updated.  Objective:   Vitals:   07/01/18 0817  BP: 110/74  Pulse: 98  Weight: 216 lb 6.4 oz (98.2 kg)    Fetal Status: Fetal Heart Rate (bpm): 145   Movement: Present     General:  Alert, oriented and cooperative. Patient is in no acute distress.  Skin: Skin is warm and dry. No rash noted.   Cardiovascular: Normal heart rate noted  Respiratory: Normal respiratory effort, no problems with respiration noted  Abdomen: Soft, gravid, appropriate for gestational age.  Pain/Pressure: Present     Pelvic: Cervical exam performed Dilation: Closed Effacement (%): 40 Station: Ballotable  Extremities: Normal range of motion.  Edema: Mild pitting, slight indentation  Mental Status: Normal mood and affect. Normal behavior. Normal judgment and thought content.   Assessment and Plan:  Pregnancy: G1P0 at 6660w4d  1. Supervision of normal first pregnancy, antepartum - Discussed pain management options for labor/delivery (nothing, IV pain meds, nitrous oxide, and epidural) - Undecided on family planning options -- pamphlets given for Depo, Nexplanon and Liletta IUD -  Culture, beta strep (group b only) - Cervicovaginal ancillary only  2. Vaginal discharge during pregnancy in third trimester - Cervicovaginal ancillary only  Term labor symptoms and general obstetric precautions including but not limited to vaginal bleeding, contractions, leaking of fluid and fetal movement were reviewed in detail with the patient. Please refer to After Visit Summary for other counseling recommendations.  Return in about 1 week (around 07/08/2018) for Return OB visit.  Future Appointments  Date Time Provider Department Center  07/07/2018  8:15 AM Judeth HornLawrence, Erin, [facility]  07/15/2018  8:15 AM Anna Choi, Anna Choi, CNM [facility]  07/22/2018  8:15 AM Judeth HornLawrence, Erin, [facility]    Anna Moraolitta Nyla Creason, CNM

## 2018-07-02 LAB — CERVICOVAGINAL ANCILLARY ONLY
Bacterial vaginitis: NEGATIVE
CHLAMYDIA, DNA PROBE: NEGATIVE
Candida vaginitis: POSITIVE — AB
Neisseria Gonorrhea: NEGATIVE
Trichomonas: NEGATIVE

## 2018-07-05 LAB — CULTURE, BETA STREP (GROUP B ONLY): Strep Gp B Culture: NEGATIVE

## 2018-07-06 ENCOUNTER — Other Ambulatory Visit: Payer: Self-pay | Admitting: *Deleted

## 2018-07-06 ENCOUNTER — Encounter: Payer: Self-pay | Admitting: *Deleted

## 2018-07-06 DIAGNOSIS — N898 Other specified noninflammatory disorders of vagina: Secondary | ICD-10-CM

## 2018-07-06 DIAGNOSIS — O26893 Other specified pregnancy related conditions, third trimester: Principal | ICD-10-CM

## 2018-07-06 MED ORDER — TERCONAZOLE 0.4 % VA CREA: TOPICAL_CREAM | VAGINAL | 0 refills | Status: DC

## 2018-07-07 ENCOUNTER — Ambulatory Visit (INDEPENDENT_AMBULATORY_CARE_PROVIDER_SITE_OTHER): Payer: Medicaid Other | Admitting: Student

## 2018-07-07 DIAGNOSIS — Z3400 Encounter for supervision of normal first pregnancy, unspecified trimester: Secondary | ICD-10-CM

## 2018-07-07 NOTE — Progress Notes (Signed)
   PRENATAL VISIT NOTE  Subjective:  Anna Choi is a 22 y.o. G1P0 at 6127w3d being seen today for ongoing prenatal care.  She is currently monitored for the following issues for this low-risk pregnancy which is complicated by asthma, LGSIL, melasma, GERD and morning sickness.   Patient reports no complaints.  Contractions: Not present. Vag. Bleeding: None.  Movement: Present. Denies leaking of fluid.   The following portions of the patient's history were reviewed and updated as appropriate: allergies, current medications, past family history, past medical history, past social history, past surgical history and problem list. Problem list updated.  Objective:   Vitals:   07/07/18 0817  BP: 120/70  Pulse: 90  Weight: 217 lb (98.4 kg)    Fetal Status: Fetal Heart Rate (bpm): 147   Movement: Present     General:  Alert, oriented and cooperative. Patient is in no acute distress.  Skin: Skin is warm and dry. No rash noted.   Cardiovascular: Normal heart rate noted  Respiratory: Normal respiratory effort, no problems with respiration noted  Abdomen: Soft, gravid, appropriate for gestational age.  Pain/Pressure: Absent     Pelvic: Cervical exam deferred        Extremities: Normal range of motion.     Mental Status: Normal mood and affect. Normal behavior. Normal judgment and thought content.   Assessment and Plan:  Pregnancy: G1P0 at 5327w3d  1. Supervision of normal first pregnancy, antepartum -- continue routine care -- counseled on picking out pediatrician, desires practice on [address] but unsure which one -- counseled on labor induction practice if needed, items to help with onset of natural labor, and GBS (-) status   Term labor symptoms and general obstetric precautions including but not limited to vaginal bleeding, contractions, leaking of fluid and fetal movement were reviewed in detail with the patient. Please refer to After Visit Summary for other counseling  recommendations.  Return in about 1 week (around 07/14/2018) for LROB .  Future Appointments  Date Time Provider Department Center  07/15/2018  8:15 AM Raelyn MoraDawson, Rolitta, [facility]  07/22/2018  8:15 AM Judeth HornLawrence, Erin, NP [facility]    Anna Choi, [place]

## 2018-07-07 NOTE — Patient Instructions (Signed)
AREA PEDIATRIC/FAMILY PRACTICE PHYSICIANS  [facility] [address], [address] [place], [place]  [address] Phone - [phone]   Fax - [phone]  [facility] [address] [place], [place] [address] Phone - [phone]   Fax - [phone]  [facility] [address] [place], [place]  [address] Phone - [phone]   Fax - [phone]  [facility] [address] [place], [place]  [address] Phone - [phone]   Fax - [phone]  [facility] [address] [place], [place]  [address] Phone - [phone]   Fax - [phone]  [facility] [address] [place], [place]  [address] Phone - [phone]   Fax - [phone]  [facility] [address] [place], [place]  [address] Phone - [phone]   Fax - [phone]  [facility] [address] [place], [place]  [address] Phone - [phone]   Fax - [phone]  [facility] [address] [place], [place]  [address] Phone - [phone]   Fax - [phone] [facility] [address] [place], [place]  [address] Phone - [phone]   Fax - [phone]  [facility] [address] [place], [place]  [address] Phone - [phone]   Fax - [phone]  [facility] [address] [place], [place]  [address] Phone - [phone]   Fax - [phone]  [facility] [address] [place], [place]  [address] Phone - [phone]   Fax - [phone]  SHILPA GOSRANI [address] [place], [place]  [address] Phone - [phone]  [facility] [address] [place], [place]  [address] Phone - [phone]   Fax - [phone]  [facility] [address] [place], [place]  [address] Phone - [phone]   Fax - [phone]  [facility] [address] [place], [place]  [address] Phone - [phone]   Fax - [phone]  [facility] [address] [place], [place]  [address] Phone - [phone]   Fax - [phone]   [facility] [address] [place], [place]  [address] Phone - [phone]   Fax - [phone]  [facility] [address] [place], [place]  [address] Phone - [phone]   Fax - [phone]  [facility] [address] [place], [place]  [address] Phone - [phone]   Fax - [phone]  [facility] [address] [place], [place]  [address] Phone - [phone]   Fax - [phone]  [facility] [address] , [place]  [address] Phone - [phone]   Fax - [phone] [place] [facility] [address] [place], [place]  [address] Phone - [phone]   Fax - [phone]  [facility] [address] [place], [place]  [address] Phone - [phone]   Fax - [phone]  [facility] [address] [place], [place]  [address] Phone - [phone]   Fax - [phone]  [facility] Charlene Flemming MD [address] [place]  [address] Phone [phone]  Fax [phone]   

## 2018-07-15 ENCOUNTER — Ambulatory Visit (INDEPENDENT_AMBULATORY_CARE_PROVIDER_SITE_OTHER): Payer: Medicaid Other | Admitting: Obstetrics and Gynecology

## 2018-07-15 VITALS — BP 124/65 | HR 90 | Wt 219.7 lb

## 2018-07-15 DIAGNOSIS — Z3400 Encounter for supervision of normal first pregnancy, unspecified trimester: Secondary | ICD-10-CM

## 2018-07-15 NOTE — Progress Notes (Signed)
   PRENATAL VISIT NOTE  Subjective:  Anna Choi is a 22 y.o. G1P0 at 4312w4d being seen today for ongoing prenatal care.  She is currently monitored for the following issues for this low-risk pregnancy and has Supervision of normal first pregnancy, antepartum; Asthma; Low grade squamous intraepith lesion on cytologic smear cervix (lgsil); Melasma gravidarum; and GERD (gastroesophageal reflux disease) on their problem list.  Patient reports some pelvic pressure.  Contractions: Not present. Vag. Bleeding: None.  Movement: Present. Denies leaking of fluid. Taking Terazol cream for yeast infection.  The following portions of the patient's history were reviewed and updated as appropriate: allergies, current medications, past family history, past medical history, past social history, past surgical history and problem list. Problem list updated.  Objective:   Vitals:   07/15/18 0824  BP: 124/65  Pulse: 90  Weight: 219 lb 11.2 oz (99.7 kg)    Fetal Status: Fetal Heart Rate (bpm): 148 Fundal Height: 39 cm Movement: Present  Presentation: Vertex  General:  Alert, oriented and cooperative. Patient is in no acute distress.  Skin: Skin is warm and dry. No rash noted.   Cardiovascular: Normal heart rate noted  Respiratory: Normal respiratory effort, no problems with respiration noted  Abdomen: Soft, gravid, appropriate for gestational age.  Pain/Pressure: Present     Pelvic: Cervical exam deferred        Extremities: Normal range of motion.  Edema: Mild pitting, slight indentation  Mental Status: Normal mood and affect. Normal behavior. Normal judgment and thought content.   Assessment and Plan:  Pregnancy: G1P0 at 7512w4d  Supervision of normal first pregnancy, antepartum  Term labor symptoms and general obstetric precautions including but not limited to vaginal bleeding, contractions, leaking of fluid and fetal movement were reviewed in detail with the patient. Please refer to After  Visit Summary for other counseling recommendations.  Return in about 1 week (around 07/22/2018) for Return OB visit.  Future Appointments  Date Time Provider Department Center  07/22/2018  8:15 AM Judeth HornLawrence, Erin, [facility]    Raelyn Moraolitta Gayle Collard, CNM

## 2018-07-15 NOTE — Patient Instructions (Signed)
Braxton Hicks Contractions °Contractions of the uterus can occur throughout pregnancy, but they are not always a sign that you are in labor. You may have practice contractions called Braxton Hicks contractions. These false labor contractions are sometimes confused with true labor. °What are Braxton Hicks contractions? °Braxton Hicks contractions are tightening movements that occur in the muscles of the uterus before labor. Unlike true labor contractions, these contractions do not result in opening (dilation) and thinning of the cervix. Toward the end of pregnancy (32-34 weeks), Braxton Hicks contractions can happen more often and may become stronger. These contractions are sometimes difficult to tell apart from true labor because they can be very uncomfortable. You should not feel embarrassed if you go to the hospital with false labor. °Sometimes, the only way to tell if you are in true labor is for your health care provider to look for changes in the cervix. The health care provider will do a physical exam and may monitor your contractions. If you are not in true labor, the exam should show that your cervix is not dilating and your water has not broken. °If there are other health problems associated with your pregnancy, it is completely safe for you to be sent home with false labor. You may continue to have Braxton Hicks contractions until you go into true labor. °How to tell the difference between true labor and false labor °True labor °· Contractions last 30-70 seconds. °· Contractions become very regular. °· Discomfort is usually felt in the top of the uterus, and it spreads to the lower abdomen and low back. °· Contractions do not go away with walking. °· Contractions usually become more intense and increase in frequency. °· The cervix dilates and gets thinner. °False labor °· Contractions are usually shorter and not as strong as true labor contractions. °· Contractions are usually irregular. °· Contractions  are often felt in the front of the lower abdomen and in the groin. °· Contractions may go away when you walk around or change positions while lying down. °· Contractions get weaker and are shorter-lasting as time goes on. °· The cervix usually does not dilate or become thin. °Follow these instructions at home: °· Take over-the-counter and prescription medicines only as told by your health care provider. °· Keep up with your usual exercises and follow other instructions from your health care provider. °· Eat and drink lightly if you think you are going into labor. °· If Braxton Hicks contractions are making you uncomfortable: °? Change your position from lying down or resting to walking, or change from walking to resting. °? Sit and rest in a tub of warm water. °? Drink enough fluid to keep your urine pale yellow. Dehydration may cause these contractions. °? Do slow and deep breathing several times an hour. °· Keep all follow-up prenatal visits as told by your health care provider. This is important. °Contact a health care provider if: °· You have a fever. °· You have continuous pain in your abdomen. °Get help right away if: °· Your contractions become stronger, more regular, and closer together. °· You have fluid leaking or gushing from your vagina. °· You pass blood-tinged mucus (bloody show). °· You have bleeding from your vagina. °· You have low back pain that you never had before. °· You feel your baby’s head pushing down and causing pelvic pressure. °· Your baby is not moving inside you as much as it used to. °Summary °· Contractions that occur before labor are called Braxton   Hicks contractions, false labor, or practice contractions. °· Braxton Hicks contractions are usually shorter, weaker, farther apart, and less regular than true labor contractions. True labor contractions usually become progressively stronger and regular and they become more frequent. °· Manage discomfort from Braxton Hicks contractions by  changing position, resting in a warm bath, drinking plenty of water, or practicing deep breathing. °This information is not intended to replace advice given to you by your health care provider. Make sure you discuss any questions you have with your health care provider. °Document Released: 03/27/2017 Document Revised: 03/27/2017 Document Reviewed: 03/27/2017 °Elsevier Interactive Patient Education © 2018 [employer] ° °

## 2018-07-22 ENCOUNTER — Ambulatory Visit (INDEPENDENT_AMBULATORY_CARE_PROVIDER_SITE_OTHER): Payer: Medicaid Other | Admitting: Student

## 2018-07-22 VITALS — BP 119/67 | HR 84 | Wt 223.5 lb

## 2018-07-22 DIAGNOSIS — Z3400 Encounter for supervision of normal first pregnancy, unspecified trimester: Secondary | ICD-10-CM

## 2018-07-22 DIAGNOSIS — Z3403 Encounter for supervision of normal first pregnancy, third trimester: Secondary | ICD-10-CM

## 2018-07-22 NOTE — Progress Notes (Signed)
   PRENATAL VISIT NOTE  Subjective:  Anna Choi is a 22 y.o. G1P0 at 4233w4d being seen today for ongoing prenatal care.  She is currently monitored for the following issues for this low-risk pregnancy and has Supervision of normal first pregnancy, antepartum; Asthma; Low grade squamous intraepith lesion on cytologic smear cervix (lgsil); Melasma gravidarum; and GERD (gastroesophageal reflux disease) on their problem list.  Patient reports no complaints.  Contractions: Not present. Vag. Bleeding: None.  Movement: Present. Denies leaking of fluid.   The following portions of the patient's history were reviewed and updated as appropriate: allergies, current medications, past family history, past medical history, past social history, past surgical history and problem list. Problem list updated.  Objective:   Vitals:   07/22/18 0827  BP: 119/67  Pulse: 84  Weight: 223 lb 8 oz (101.4 kg)    Fetal Status: Fetal Heart Rate (bpm): 143 Fundal Height: 38 cm Movement: Present  Presentation: Vertex  General:  Alert, oriented and cooperative. Patient is in no acute distress.  Skin: Skin is warm and dry. No rash noted.   Cardiovascular: Normal heart rate noted  Respiratory: Normal respiratory effort, no problems with respiration noted  Abdomen: Soft, gravid, appropriate for gestational age.  Pain/Pressure: Present     Pelvic: Cervical exam performed Dilation: 1 Effacement (%): 50 Station: -3  Extremities: Normal range of motion.  Edema: Mild pitting, slight indentation  Mental Status: Normal mood and affect. Normal behavior. Normal judgment and thought content.   Assessment and Plan:  Pregnancy: G1P0 at 6233w4d  1. Supervision of normal first pregnancy, antepartum -doing well -membranes stripped -IOL scheduled & pt to return next week for NST/BPP, determine if still candidate for foley bulb -Knows pediatrician but doesn't know the name of the office. Will make sure to bring info when  comes in for delivery.   Term labor symptoms and general obstetric precautions including but not limited to vaginal bleeding, contractions, leaking of fluid and fetal movement were reviewed in detail with the patient. Please refer to After Visit Summary for other counseling recommendations.  Return in about 6 days (around 07/28/2018) for Routine OB, NST/BPP.  No future appointments.  Judeth HornErin Jahzaria Vary, NP

## 2018-07-22 NOTE — Patient Instructions (Signed)
Labor Induction Labor induction is when steps are taken to cause a pregnant woman to begin the labor process. Most women go into labor on their own between 37 weeks and 42 weeks of the pregnancy. When this does not happen or when there is a medical need, methods may be used to induce labor. Labor induction causes a pregnant woman's uterus to contract. It also causes the cervix to soften (ripen), open (dilate), and thin out (efface). Usually, labor is not induced before 39 weeks of the pregnancy unless there is a problem with the baby or mother. Before inducing labor, your health care provider will consider a number of factors, including the following:  The medical condition of you and the baby.  How many weeks along you are.  The status of the baby's lung maturity.  The condition of the cervix.  The position of the baby. What are the reasons for labor induction? Labor may be induced for the following reasons:  The health of the baby or mother is at risk.  The pregnancy is overdue by 1 week or more.  The water breaks but labor does not start on its own.  The mother has a health condition or serious illness, such as high blood pressure, infection, placental abruption, or diabetes.  The amniotic fluid amounts are low around the baby.  The baby is distressed. Convenience or wanting the baby to be born on a certain date is not a reason for inducing labor. What methods are used for labor induction? Several methods of labor induction may be used, such as:  Prostaglandin medicine. This medicine causes the cervix to dilate and ripen. The medicine will also start contractions. It can be taken by mouth or by inserting a suppository into the vagina.  Inserting a thin tube (catheter) with a balloon on the end into the vagina to dilate the cervix. Once inserted, the balloon is expanded with water, which causes the cervix to open.  Stripping the membranes. Your health care provider separates  amniotic sac tissue from the cervix, causing the cervix to be stretched and causing the release of a hormone called progesterone. This may cause the uterus to contract. It is often done during an office visit. You will be sent home to wait for the contractions to begin. You will then come in for an induction.  Breaking the water. Your health care provider makes a hole in the amniotic sac using a small instrument. Once the amniotic sac breaks, contractions should begin. This may still take hours to see an effect.  Medicine to trigger or strengthen contractions. This medicine is given through an IV access tube inserted into a vein in your arm. All of the methods of induction, besides stripping the membranes, will be done in the hospital. Induction is done in the hospital so that you and the baby can be carefully monitored. How long does it take for labor to be induced? Some inductions can take up to 2-3 days. Depending on the cervix, it usually takes less time. It takes longer when you are induced early in the pregnancy or if this is your first pregnancy. If a mother is still pregnant and the induction has been going on for 2-3 days, either the mother will be sent home or a cesarean delivery will be needed. What are the risks associated with labor induction? Some of the risks of induction include:  Changes in fetal heart rate, such as too high, too low, or erratic.  Fetal distress.    Chance of infection for the mother and baby.  Increased chance of having a cesarean delivery.  Breaking off (abruption) of the placenta from the uterus (rare).  Uterine rupture (very rare). When induction is needed for medical reasons, the benefits of induction may outweigh the risks. What are some reasons for not inducing labor? Labor induction should not be done if:  It is shown that your baby does not tolerate labor.  You have had previous surgeries on your uterus, such as a myomectomy or the removal of  fibroids.  Your placenta lies very low in the uterus and blocks the opening of the cervix (placenta previa).  Your baby is not in a head-down position.  The umbilical cord drops down into the birth canal in front of the baby. This could cut off the baby's blood and oxygen supply.  You have had a previous cesarean delivery.  There are unusual circumstances, such as the baby being extremely premature. This information is not intended to replace advice given to you by your health care provider. Make sure you discuss any questions you have with your health care provider. Document Released: 04/02/2007 Document Revised: 04/18/2016 Document Reviewed: 06/10/2013 Elsevier Interactive Patient Education  2017 Elsevier Inc.  

## 2018-07-22 NOTE — Progress Notes (Signed)
IOL scheduled for 08/01/18 @ 0700.  Pt notified.

## 2018-07-23 ENCOUNTER — Encounter (HOSPITAL_COMMUNITY): Payer: Self-pay | Admitting: *Deleted

## 2018-07-23 ENCOUNTER — Telehealth (HOSPITAL_COMMUNITY): Payer: Self-pay | Admitting: *Deleted

## 2018-07-23 NOTE — Telephone Encounter (Signed)
Preadmission screen  

## 2018-07-23 NOTE — Addendum Note (Signed)
Addended by: Judeth HornLAWRENCE, Arnelle Nale B on: 07/23/2018 08:10 AM   Modules accepted: Orders, SmartSet

## 2018-07-24 ENCOUNTER — Inpatient Hospital Stay (HOSPITAL_COMMUNITY)
Admission: AD | Admit: 2018-07-24 | Discharge: 2018-07-24 | Disposition: A | Discharge status: Home / Self Care | Payer: Medicaid Other | Source: Ambulatory Visit | Attending: Obstetrics and Gynecology | Admitting: Obstetrics and Gynecology

## 2018-07-24 ENCOUNTER — Other Ambulatory Visit: Payer: Self-pay

## 2018-07-24 ENCOUNTER — Encounter (HOSPITAL_COMMUNITY): Payer: Self-pay

## 2018-07-24 DIAGNOSIS — Z3A41 41 weeks gestation of pregnancy: Secondary | ICD-10-CM | POA: Diagnosis not present

## 2018-07-24 DIAGNOSIS — Z3483 Encounter for supervision of other normal pregnancy, third trimester: Secondary | ICD-10-CM | POA: Insufficient documentation

## 2018-07-24 DIAGNOSIS — O471 False labor at or after 37 completed weeks of gestation: Secondary | ICD-10-CM

## 2018-07-24 NOTE — MAU Note (Signed)
Mucous plug came out and she has been cramping. Little spotting.  Was 1 cm a couple days ago

## 2018-07-24 NOTE — Discharge Instructions (Signed)
Braxton Hicks Contractions °Contractions of the uterus can occur throughout pregnancy, but they are not always a sign that you are in labor. You may have practice contractions called Braxton Hicks contractions. These false labor contractions are sometimes confused with true labor. °What are Braxton Hicks contractions? °Braxton Hicks contractions are tightening movements that occur in the muscles of the uterus before labor. Unlike true labor contractions, these contractions do not result in opening (dilation) and thinning of the cervix. Toward the end of pregnancy (32-34 weeks), Braxton Hicks contractions can happen more often and may become stronger. These contractions are sometimes difficult to tell apart from true labor because they can be very uncomfortable. You should not feel embarrassed if you go to the hospital with false labor. °Sometimes, the only way to tell if you are in true labor is for your health care provider to look for changes in the cervix. The health care provider will do a physical exam and may monitor your contractions. If you are not in true labor, the exam should show that your cervix is not dilating and your water has not broken. °If there are other health problems associated with your pregnancy, it is completely safe for you to be sent home with false labor. You may continue to have Braxton Hicks contractions until you go into true labor. °How to tell the difference between true labor and false labor °True labor °· Contractions last 30-70 seconds. °· Contractions become very regular. °· Discomfort is usually felt in the top of the uterus, and it spreads to the lower abdomen and low back. °· Contractions do not go away with walking. °· Contractions usually become more intense and increase in frequency. °· The cervix dilates and gets thinner. °False labor °· Contractions are usually shorter and not as strong as true labor contractions. °· Contractions are usually irregular. °· Contractions  are often felt in the front of the lower abdomen and in the groin. °· Contractions may go away when you walk around or change positions while lying down. °· Contractions get weaker and are shorter-lasting as time goes on. °· The cervix usually does not dilate or become thin. °Follow these instructions at home: °· Take over-the-counter and prescription medicines only as told by your health care provider. °· Keep up with your usual exercises and follow other instructions from your health care provider. °· Eat and drink lightly if you think you are going into labor. °· If Braxton Hicks contractions are making you uncomfortable: °? Change your position from lying down or resting to walking, or change from walking to resting. °? Sit and rest in a tub of warm water. °? Drink enough fluid to keep your urine pale yellow. Dehydration may cause these contractions. °? Do slow and deep breathing several times an hour. °· Keep all follow-up prenatal visits as told by your health care provider. This is important. °Contact a health care provider if: °· You have a fever. °· You have continuous pain in your abdomen. °Get help right away if: °· Your contractions become stronger, more regular, and closer together. °· You have fluid leaking or gushing from your vagina. °· You pass blood-tinged mucus (bloody show). °· You have bleeding from your vagina. °· You have low back pain that you never had before. °· You feel your baby’s head pushing down and causing pelvic pressure. °· Your baby is not moving inside you as much as it used to. °Summary °· Contractions that occur before labor are called Braxton   Hicks contractions, false labor, or practice contractions. °· Braxton Hicks contractions are usually shorter, weaker, farther apart, and less regular than true labor contractions. True labor contractions usually become progressively stronger and regular and they become more frequent. °· Manage discomfort from Braxton Hicks contractions by  changing position, resting in a warm bath, drinking plenty of water, or practicing deep breathing. °This information is not intended to replace advice given to you by your health care provider. Make sure you discuss any questions you have with your health care provider. °Document Released: 03/27/2017 Document Revised: 03/27/2017 Document Reviewed: 03/27/2017 °Elsevier Interactive Patient Education © 2018 Elsevier Inc. ° °Fetal Movement Counts °Patient Name: ________________________________________________ Patient Due Date: ____________________ °What is a fetal movement count? °A fetal movement count is the number of times that you feel your baby move during a certain amount of time. This may also be called a fetal kick count. A fetal movement count is recommended for every pregnant woman. You may be asked to start counting fetal movements as early as week 28 of your pregnancy. °Pay attention to when your baby is most active. You may notice your baby's sleep and wake cycles. You may also notice things that make your baby move more. You should do a fetal movement count: °· When your baby is normally most active. °· At the same time each day. ° °A good time to count movements is while you are resting, after having something to eat and drink. °How do I count fetal movements? °1. Find a quiet, comfortable area. Sit, or lie down on your side. °2. Write down the date, the start time and stop time, and the number of movements that you felt between those two times. Take this information with you to your health care visits. °3. For 2 hours, count kicks, flutters, swishes, rolls, and jabs. You should feel at least 10 movements during 2 hours. °4. You may stop counting after you have felt 10 movements. °5. If you do not feel 10 movements in 2 hours, have something to eat and drink. Then, keep resting and counting for 1 hour. If you feel at least 4 movements during that hour, you may stop counting. °Contact a health care  provider if: °· You feel fewer than 4 movements in 2 hours. °· Your baby is not moving like he or she usually does. °Date: ____________ Start time: ____________ Stop time: ____________ Movements: ____________ °Date: ____________ Start time: ____________ Stop time: ____________ Movements: ____________ °Date: ____________ Start time: ____________ Stop time: ____________ Movements: ____________ °Date: ____________ Start time: ____________ Stop time: ____________ Movements: ____________ °Date: ____________ Start time: ____________ Stop time: ____________ Movements: ____________ °Date: ____________ Start time: ____________ Stop time: ____________ Movements: ____________ °Date: ____________ Start time: ____________ Stop time: ____________ Movements: ____________ °Date: ____________ Start time: ____________ Stop time: ____________ Movements: ____________ °Date: ____________ Start time: ____________ Stop time: ____________ Movements: ____________ °This information is not intended to replace advice given to you by your health care provider. Make sure you discuss any questions you have with your health care provider. °Document Released: 12/11/2006 Document Revised: 07/10/2016 Document Reviewed: 12/21/2015 °Elsevier Interactive Patient Education © 2018 Elsevier Inc. ° °

## 2018-07-24 NOTE — MAU Note (Signed)
I have communicated with Ma Hillock. Neill CNM and reviewed vital signs:  Vitals:   07/24/18 1412 07/24/18 1451  BP: 123/86   Pulse: (!) 113   Resp: 18 16  Temp: 99.2 F (37.3 C)   SpO2: 100%     Vaginal exam:  Dilation: 1 Effacement (%): 10 Cervical Position: Posterior Station: -3 Exam by:: Ginnie Smartachel Jarome Trull RN,   Also reviewed contraction pattern and that non-stress test is reactive.  It has been documented that patient is contracting irregularly with no cervical change over 48 hours not indicating active labor.  Patient denies any other complaints.  Based on this report provider has given order for discharge.  A discharge order and diagnosis entered by a provider.   Labor discharge instructions reviewed with patient.

## 2018-07-29 ENCOUNTER — Ambulatory Visit: Payer: Self-pay

## 2018-07-29 ENCOUNTER — Ambulatory Visit (INDEPENDENT_AMBULATORY_CARE_PROVIDER_SITE_OTHER): Payer: Medicaid Other | Admitting: *Deleted

## 2018-07-29 ENCOUNTER — Ambulatory Visit (INDEPENDENT_AMBULATORY_CARE_PROVIDER_SITE_OTHER): Payer: Medicaid Other

## 2018-07-29 VITALS — BP 103/75 | HR 100 | Wt 225.4 lb

## 2018-07-29 DIAGNOSIS — Z8709 Personal history of other diseases of the respiratory system: Secondary | ICD-10-CM

## 2018-07-29 DIAGNOSIS — Z3400 Encounter for supervision of normal first pregnancy, unspecified trimester: Secondary | ICD-10-CM

## 2018-07-29 DIAGNOSIS — O480 Post-term pregnancy: Secondary | ICD-10-CM | POA: Diagnosis present

## 2018-07-29 MED ORDER — ALBUTEROL SULFATE HFA 108 (90 BASE) MCG/ACT IN AERS: 2.0000 | INHALATION_SPRAY | RESPIRATORY_TRACT | 3 refills | Status: DC | PRN

## 2018-07-29 NOTE — Progress Notes (Signed)

## 2018-07-29 NOTE — Patient Instructions (Signed)
Labor Induction Labor induction is when steps are taken to cause a pregnant woman to begin the labor process. Most women go into labor on their own between 37 weeks and 42 weeks of the pregnancy. When this does not happen or when there is a medical need, methods may be used to induce labor. Labor induction causes a pregnant woman's uterus to contract. It also causes the cervix to soften (ripen), open (dilate), and thin out (efface). Usually, labor is not induced before 39 weeks of the pregnancy unless there is a problem with the baby or mother. Before inducing labor, your health care provider will consider a number of factors, including the following:  The medical condition of you and the baby.  How many weeks along you are.  The status of the baby's lung maturity.  The condition of the cervix.  The position of the baby. What are the reasons for labor induction? Labor may be induced for the following reasons:  The health of the baby or mother is at risk.  The pregnancy is overdue by 1 week or more.  The water breaks but labor does not start on its own.  The mother has a health condition or serious illness, such as high blood pressure, infection, placental abruption, or diabetes.  The amniotic fluid amounts are low around the baby.  The baby is distressed. Convenience or wanting the baby to be born on a certain date is not a reason for inducing labor. What methods are used for labor induction? Several methods of labor induction may be used, such as:  Prostaglandin medicine. This medicine causes the cervix to dilate and ripen. The medicine will also start contractions. It can be taken by mouth or by inserting a suppository into the vagina.  Inserting a thin tube (catheter) with a balloon on the end into the vagina to dilate the cervix. Once inserted, the balloon is expanded with water, which causes the cervix to open.  Stripping the membranes. Your health care provider separates  amniotic sac tissue from the cervix, causing the cervix to be stretched and causing the release of a hormone called progesterone. This may cause the uterus to contract. It is often done during an office visit. You will be sent home to wait for the contractions to begin. You will then come in for an induction.  Breaking the water. Your health care provider makes a hole in the amniotic sac using a small instrument. Once the amniotic sac breaks, contractions should begin. This may still take hours to see an effect.  Medicine to trigger or strengthen contractions. This medicine is given through an IV access tube inserted into a vein in your arm. All of the methods of induction, besides stripping the membranes, will be done in the hospital. Induction is done in the hospital so that you and the baby can be carefully monitored. How long does it take for labor to be induced? Some inductions can take up to 2-3 days. Depending on the cervix, it usually takes less time. It takes longer when you are induced early in the pregnancy or if this is your first pregnancy. If a mother is still pregnant and the induction has been going on for 2-3 days, either the mother will be sent home or a cesarean delivery will be needed. What are the risks associated with labor induction? Some of the risks of induction include:  Changes in fetal heart rate, such as too high, too low, or erratic.  Fetal distress.    Chance of infection for the mother and baby.  Increased chance of having a cesarean delivery.  Breaking off (abruption) of the placenta from the uterus (rare).  Uterine rupture (very rare). When induction is needed for medical reasons, the benefits of induction may outweigh the risks. What are some reasons for not inducing labor? Labor induction should not be done if:  It is shown that your baby does not tolerate labor.  You have had previous surgeries on your uterus, such as a myomectomy or the removal of  fibroids.  Your placenta lies very low in the uterus and blocks the opening of the cervix (placenta previa).  Your baby is not in a head-down position.  The umbilical cord drops down into the birth canal in front of the baby. This could cut off the baby's blood and oxygen supply.  You have had a previous cesarean delivery.  There are unusual circumstances, such as the baby being extremely premature. This information is not intended to replace advice given to you by your health care provider. Make sure you discuss any questions you have with your health care provider. Document Released: 04/02/2007 Document Revised: 04/18/2016 Document Reviewed: 06/10/2013 Elsevier Interactive Patient Education  2017 Elsevier Inc.  

## 2018-07-29 NOTE — Progress Notes (Signed)
   PRENATAL VISIT NOTE  Subjective:  Anna Choi is a 22 y.o. G1P0 at [redacted]w[redacted]d being seen today for ongoing prenatal care.  She is currently monitored for the following issues for this low-risk pregnancy and has Supervision of normal first pregnancy, antepartum; Asthma; Low grade squamous intraepith lesion on cytologic smear cervix (lgsil); Melasma gravidarum; and GERD (gastroesophageal reflux disease) on their problem list.  Patient reports no complaints.  Contractions: Not present. Vag. Bleeding: None.  Movement: Present. Denies leaking of fluid.   The following portions of the patient's history were reviewed and updated as appropriate: allergies, current medications, past family history, past medical history, past social history, past surgical history and problem list. Problem list updated.  Objective:   Vitals:   07/29/18 1018  BP: 103/75  Pulse: 100  Weight: 225 lb 6.4 oz (102.2 kg)    Fetal Status: Fetal Heart Rate (bpm): NST   Movement: Present     General:  Alert, oriented and cooperative. Patient is in no acute distress.  Skin: Skin is warm and dry. No rash noted.   Cardiovascular: Normal heart rate noted  Respiratory: Normal respiratory effort, no problems with respiration noted  Abdomen: Soft, gravid, appropriate for gestational age.  Pain/Pressure: Present     Pelvic: Cervical exam deferred        Extremities: Normal range of motion.     Mental Status: Normal mood and affect. Normal behavior. Normal judgment and thought content.   Assessment and Plan:  Pregnancy: G1P0 at [redacted]w[redacted]d  1. Supervision of normal first pregnancy, antepartum - No complaints. Routine care  2. Post term pregnancy, antepartum condition or complication -NST reactive. BPP 8/8  3. History of asthma - albuterol (PROVENTIL HFA;VENTOLIN HFA) 108 (90 Base) MCG/ACT inhaler; Inhale 2 puffs into the lungs every 4 (four) hours as needed for wheezing or shortness of breath.  Dispense: 1 Inhaler; Refill:  3  Term labor symptoms and general obstetric precautions including but not limited to vaginal bleeding, contractions, leaking of fluid and fetal movement were reviewed in detail with the patient. Please refer to After Visit Summary for other counseling recommendations.  Return in about 3 days (around 08/01/2018) for your induction.  Future Appointments  Date Time Provider Department Center  07/29/2018 11:15 AM Rolm Bookbinder, CNM [facility]  08/01/2018  7:00 AM [facility] RO[facility] None   Rolm Bookbinder, [place] 07/29/18 11:07 AM

## 2018-07-29 NOTE — Progress Notes (Signed)
Pt has not been able to obtain Albuteral inhaler from pharmacy.  Per chart review, Rx has expired - reorder e-prescribed.  IOL scheduled 9/7 @ 0700.

## 2018-08-01 ENCOUNTER — Other Ambulatory Visit: Payer: Self-pay

## 2018-08-01 ENCOUNTER — Inpatient Hospital Stay (HOSPITAL_COMMUNITY)
Admission: RE | Admit: 2018-08-01 | Discharge: 2018-08-05 | DRG: 787 | Disposition: A | Discharge status: Home / Self Care | Payer: Medicaid Other | Attending: Obstetrics and Gynecology | Admitting: Obstetrics and Gynecology

## 2018-08-01 ENCOUNTER — Encounter (HOSPITAL_COMMUNITY): Payer: Self-pay

## 2018-08-01 VITALS — BP 108/64 | HR 80 | Temp 97.8°F | Resp 18 | Ht 63.0 in | Wt 228.2 lb

## 2018-08-01 DIAGNOSIS — Z98891 History of uterine scar from previous surgery: Secondary | ICD-10-CM

## 2018-08-01 DIAGNOSIS — O9962 Diseases of the digestive system complicating childbirth: Secondary | ICD-10-CM | POA: Diagnosis present

## 2018-08-01 DIAGNOSIS — L811 Chloasma: Secondary | ICD-10-CM

## 2018-08-01 DIAGNOSIS — O99214 Obesity complicating childbirth: Secondary | ICD-10-CM | POA: Diagnosis present

## 2018-08-01 DIAGNOSIS — O480 Post-term pregnancy: Secondary | ICD-10-CM | POA: Diagnosis present

## 2018-08-01 DIAGNOSIS — K219 Gastro-esophageal reflux disease without esophagitis: Secondary | ICD-10-CM | POA: Diagnosis present

## 2018-08-01 DIAGNOSIS — O99719 Diseases of the skin and subcutaneous tissue complicating pregnancy, unspecified trimester: Secondary | ICD-10-CM

## 2018-08-01 DIAGNOSIS — Z3A41 41 weeks gestation of pregnancy: Secondary | ICD-10-CM

## 2018-08-01 LAB — CBC
HCT: 37.9 % (ref 36.0–46.0)
Hemoglobin: 12.7 g/dL (ref 12.0–15.0)
MCH: 30.4 pg (ref 26.0–34.0)
MCHC: 33.5 g/dL (ref 30.0–36.0)
MCV: 90.7 fL (ref 78.0–100.0)
PLATELETS: 206 10*3/uL (ref 150–400)
RBC: 4.18 MIL/uL (ref 3.87–5.11)
RDW: 13.2 % (ref 11.5–15.5)
WBC: 10.9 10*3/uL — AB (ref 4.0–10.5)

## 2018-08-01 LAB — TYPE AND SCREEN
ABO/RH(D): A POS
Antibody Screen: NEGATIVE

## 2018-08-01 LAB — ABO/RH: ABO/RH(D): A POS

## 2018-08-01 MED ORDER — MISOPROSTOL 25 MCG QUARTER TABLET
25.0000 ug | ORAL_TABLET | ORAL | Status: DC
  Administered 2018-08-01: 25 ug via ORAL
  Filled 2018-08-01: qty 1

## 2018-08-01 MED ORDER — TERBUTALINE SULFATE 1 MG/ML IJ SOLN: 0.2500 mg | Freq: Once | INTRAMUSCULAR | Status: DC | PRN

## 2018-08-01 MED ORDER — MISOPROSTOL 50MCG HALF TABLET
50.0000 ug | ORAL_TABLET | Freq: Once | ORAL | Status: AC
  Administered 2018-08-01: 50 ug via ORAL
  Filled 2018-08-01: qty 1

## 2018-08-01 MED ORDER — ACETAMINOPHEN 325 MG PO TABS: 650.0000 mg | ORAL_TABLET | ORAL | Status: DC | PRN

## 2018-08-01 MED ORDER — OXYCODONE-ACETAMINOPHEN 5-325 MG PO TABS: 2.0000 | ORAL_TABLET | ORAL | Status: DC | PRN

## 2018-08-01 MED ORDER — ONDANSETRON HCL 4 MG/2ML IJ SOLN: 4.0000 mg | Freq: Four times a day (QID) | INTRAMUSCULAR | Status: DC | PRN

## 2018-08-01 MED ORDER — FLEET ENEMA 7-19 GM/118ML RE ENEM: 1.0000 | ENEMA | RECTAL | Status: DC | PRN

## 2018-08-01 MED ORDER — OXYTOCIN 40 UNITS IN LACTATED RINGERS INFUSION - SIMPLE MED
1.0000 m[IU]/min | INTRAVENOUS | Status: DC
  Administered 2018-08-01: 2 m[IU]/min via INTRAVENOUS
  Administered 2018-08-02: 10 m[IU]/min via INTRAVENOUS

## 2018-08-01 MED ORDER — OXYCODONE-ACETAMINOPHEN 5-325 MG PO TABS: 1.0000 | ORAL_TABLET | ORAL | Status: DC | PRN

## 2018-08-01 MED ORDER — FENTANYL CITRATE (PF) 100 MCG/2ML IJ SOLN
100.0000 ug | INTRAMUSCULAR | Status: DC | PRN
  Administered 2018-08-02 (×2): 100 ug via INTRAVENOUS
  Filled 2018-08-01 (×2): qty 2

## 2018-08-01 MED ORDER — SOD CITRATE-CITRIC ACID 500-334 MG/5ML PO SOLN
30.0000 mL | ORAL | Status: DC | PRN
  Administered 2018-08-02: 30 mL via ORAL
  Filled 2018-08-01: qty 15

## 2018-08-01 MED ORDER — LACTATED RINGERS IV SOLN
500.0000 mL | INTRAVENOUS | Status: DC | PRN
  Administered 2018-08-02: 500 mL via INTRAVENOUS

## 2018-08-01 MED ORDER — LIDOCAINE HCL (PF) 1 % IJ SOLN: 30.0000 mL | INTRAMUSCULAR | Status: DC | PRN

## 2018-08-01 MED ORDER — LACTATED RINGERS IV SOLN
INTRAVENOUS | Status: DC
  Administered 2018-08-01 – 2018-08-02 (×4): via INTRAVENOUS

## 2018-08-01 MED ORDER — OXYTOCIN 40 UNITS IN LACTATED RINGERS INFUSION - SIMPLE MED
2.5000 [IU]/h | INTRAVENOUS | Status: DC
  Filled 2018-08-01: qty 1000

## 2018-08-01 MED ORDER — OXYTOCIN BOLUS FROM INFUSION: 500.0000 mL | Freq: Once | INTRAVENOUS | Status: DC

## 2018-08-01 NOTE — H&P (Signed)
Anna Choi is a 22 y.o. female G1P0 @[redacted]w[redacted]d  presenting for IOL for postdates pregnancy. Pregnancy has been uncomplicated except for GERD, mild intermittent asthma not requiring treatment, abnormal Pap needing repeat in 1 year, and melasma of pregnancy.  She is feeling intermittent cramping but no pain on arrival for her induction.  Baby is moving well.  Clinic  [facility] Prenatal Labs  Dating LMP Blood type: A/Positive/-- (02/27 2130)   Genetic Screen 1 Screen:    AFP:     Quad:     NIPS: no result, sample inadequate Antibody:Negative (02/27 8657)  Anatomic [place] Normal female > FU normal/complete  Rubella: 1.15 (02/27 0937)  GTT Early:               Third trimester:  RPR: Non Reactive (06/13 0827)   Flu vaccine Had already HBsAg: Negative (02/27 0937)   TDaP vaccine        Declined                                       Rhogam: HIV: Non Reactive (06/13 0827)  Baby Food   Breast                                            GBS: negative  Contraception  Condoms; not interested in any other BC Pap: lsil  Circumcision  No   Pediatrician List given - desires practice on [address] but unsure which one CF: neg  Support Person Mom Gentry Fitz) SMA: reduced risk  Prenatal Classes List given  Hgb electrophoresis: normal   OB History    Gravida  1   Para      Term      Preterm      AB      Living        SAB      TAB      Ectopic      Multiple      Live Births             Past Medical History:  Diagnosis Date  . Asthma    Past Surgical History:  Procedure Laterality Date  . TONSILLECTOMY    . TYMPANOSTOMY TUBE PLACEMENT     Family History: family history is not on file. Social History:  reports that she has never smoked. She has never used smokeless tobacco. She reports that she does not drink alcohol or use drugs.     Maternal Diabetes: No Genetic Screening: Abnormal:  Results: Other: Maternal Ultrasounds/Referrals: Normal Fetal Ultrasounds or other Referrals:   None Maternal Substance Abuse:  No Significant Maternal Medications:  None Significant Maternal Lab Results:  Lab values include: Group B Strep negative Other Comments:  NIPS with inadequate sample, no results  Review of Systems  Constitutional: Negative for chills, fever and malaise/fatigue.  Eyes: Negative for blurred vision.  Respiratory: Negative for cough and shortness of breath.   Cardiovascular: Negative for chest pain.  Gastrointestinal: Negative for heartburn and vomiting.  Genitourinary: Negative for dysuria, frequency and urgency.  Musculoskeletal: Negative.   Neurological: Negative for dizziness and headaches.  Psychiatric/Behavioral: Negative for depression.   Maternal Medical History:  Reason for admission: IOL for postdates   Contractions: Frequency: irregular.   Perceived severity is mild.    Fetal activity:  Perceived fetal activity is normal.   Last perceived fetal movement was within the past hour.    Prenatal complications: no prenatal complications Prenatal Complications - Diabetes: none.    Dilation: 1.5 Effacement (%): 80 Station: -3 Exam by:: J.Follmer,RNC Blood pressure 123/73, pulse 90, temperature 98.9 F (37.2 C), temperature source Oral, resp. rate 16, height 5\' 3"  (1.6 m), weight 103.5 kg, last menstrual period 10/18/2017. Maternal Exam:  Uterine Assessment: Contraction strength is mild.  Contraction frequency is irregular.   Abdomen: Fetal presentation: vertex  Cervix: Cervix evaluated by digital exam.     Fetal Exam Fetal Monitor Review: Mode: ultrasound.   Baseline rate: 135.  Variability: moderate (6-25 bpm).   Pattern: accelerations present and no decelerations.    Fetal State Assessment: Category I - tracings are normal.     Physical Exam  Nursing note and vitals reviewed. Constitutional: She is oriented to person, place, and time. She appears well-developed and well-nourished.  Neck: Normal range of motion.   Cardiovascular: Normal rate and regular rhythm.  Respiratory: Effort normal and breath sounds normal.  GI: Soft.  Musculoskeletal: Normal range of motion.  Neurological: She is alert and oriented to person, place, and time.  Skin: Skin is warm and dry.  Psychiatric: She has a normal mood and affect. Her behavior is normal. Judgment and thought content normal.    Prenatal labs: ABO, Rh: --/--/A POS (09/07 0900) Antibody: NEG (09/07 0900) Rubella: 1.15 (02/27 0937) RPR: Non Reactive (06/13 0827)  HBsAg: Negative (02/27 8979)  HIV: Non Reactive (06/13 0827)  GBS:   Negative on 07/01/18  Assessment/Plan: 22 y.o. G1P0 @[redacted]w[redacted]d  with IOL for postdates GBS negative  Cytotec 50 mcg x 1 dose at 10 am, will reevaluate in 4 hours for next dose/change in method IV pain medication PRN Consider FB placement or Pitocin when appropriate Anticipate NSVD  Sharen Counter, CNM 10:57 AM Sharen Counter 08/01/2018, 10:49 AM

## 2018-08-01 NOTE — Progress Notes (Signed)
Anna Choi is a 22 y.o. G1P0 at [redacted]w[redacted]d by LMP admitted for induction of labor due to postdates.  Subjective: Pt comfortable, feeling some mild cramping.  Family in room for support.  Objective: BP 122/62   Pulse 93   Temp 98.5 F (36.9 C) (Oral)   Resp 16   Ht 5\' 3"  (1.6 m)   Wt 103.5 kg   LMP 10/18/2017 (Exact Date)   BMI 40.42 kg/m  No intake/output data recorded. No intake/output data recorded.  FHT:  FHR: 135 bpm, variability: moderate,  accelerations:  Present,  decelerations:  Absent UC:   irregular, every 3-10 minutes SVE:   Dilation: 2 Effacement (%): 80 Station: -2 Exam by:: leftwich-kirby, CNM  Foley bulb placed and filled to 60 ml by RN. Pt tolerated well.     Labs: Lab Results  Component Value Date   WBC 10.9 (H) 08/01/2018   HGB 12.7 08/01/2018   HCT 37.9 08/01/2018   MCV 90.7 08/01/2018   PLT 206 08/01/2018    Assessment / Plan: Induction of labor due to postdates S/P PO Cytotec x 1 FB in place  Labor: Cytotec 25 mcg placed vaginally following FB placement.  Ordered orally Q 4 hours to follow. Preeclampsia:  n/a Fetal Wellbeing:  Category I Pain Control:  n/a I/D:  GBS neg Anticipated MOD:  NSVD  Sharen Counter 08/01/2018, 3:55 PM

## 2018-08-01 NOTE — Progress Notes (Addendum)
Labor Progress Note Anna Choi is a 22 y.o. G1P0 at [redacted]w[redacted]d presented for induction of labor due to postdates. S: Doing well. Comfortable w/ IV fentanyl. Foley bulb fell out at 2100.   O:  BP 111/79   Pulse (!) 105   Temp 98.3 F (36.8 C) (Oral)   Resp 18   Ht 5\' 3"  (1.6 m)   Wt 103.5 kg   LMP 10/18/2017 (Exact Date)   BMI 40.42 kg/m  EFM: 145/mod var/+accels. CTX q21min  CVE: Dilation: 5.5 Effacement (%): 50 Cervical Position: Posterior Station: -2 Presentation: Vertex Exam by:: DR. Anatole Apollo    A&P: 22 y.o. G1P0 [redacted]w[redacted]d  for induction of labor due to postdates. #Labor: Progressing well. S/p cytotec x2. Foley bulb fell out. Start Pit.   #Pain: managed w/ IV fentanyl. Considering epidural. #FWB: Cat 1 FHT.  #GBS negative   Denzil Hughes, MD 10:46 PM

## 2018-08-01 NOTE — Anesthesia Pain Management Evaluation Note (Signed)
  CRNA Pain Management Visit Note  Patient: Anna Choi, 23 y.o., female  "Hello I am a member of the anesthesia team at [facility]. We have an anesthesia team available at all times to provide care throughout the hospital, including epidural management and anesthesia for C-section. I don't know your plan for the delivery whether it a natural birth, water birth, IV sedation, nitrous supplementation, doula or epidural, but we want to meet your pain goals."   1.Was your pain managed to your expectations on prior hospitalizations?   No prior hospitalizations  2.What is your expectation for pain management during this hospitalization?     Epidural  3.How can we help you reach that goal? unsure  Record the patient's initial score and the patient's pain goal.   Pain: 4  Pain Goal: 6 [facility] wants you to be able to say your pain was always managed very well.  Cephus Shelling 08/01/2018

## 2018-08-02 ENCOUNTER — Encounter (HOSPITAL_COMMUNITY): Payer: Self-pay

## 2018-08-02 ENCOUNTER — Inpatient Hospital Stay (HOSPITAL_COMMUNITY): Payer: Medicaid Other | Admitting: Anesthesiology

## 2018-08-02 ENCOUNTER — Encounter (HOSPITAL_COMMUNITY)
Admission: RE | Disposition: A | Discharge status: Home / Self Care | Payer: Self-pay | Source: Home / Self Care | Attending: Obstetrics and Gynecology

## 2018-08-02 DIAGNOSIS — O480 Post-term pregnancy: Secondary | ICD-10-CM

## 2018-08-02 DIAGNOSIS — Z3A41 41 weeks gestation of pregnancy: Secondary | ICD-10-CM

## 2018-08-02 LAB — RPR: RPR Ser Ql: NONREACTIVE

## 2018-08-02 SURGERY — Surgical Case
Anesthesia: Epidural

## 2018-08-02 MED ORDER — PRENATAL MULTIVITAMIN CH
1.0000 | ORAL_TABLET | Freq: Every day | ORAL | Status: DC
  Administered 2018-08-03 – 2018-08-05 (×3): 1 via ORAL
  Filled 2018-08-02 (×3): qty 1

## 2018-08-02 MED ORDER — CEFAZOLIN SODIUM-DEXTROSE 2-3 GM-%(50ML) IV SOLR
INTRAVENOUS | Status: DC | PRN
  Administered 2018-08-02: 2 g via INTRAVENOUS

## 2018-08-02 MED ORDER — MENTHOL 3 MG MT LOZG: 1.0000 | LOZENGE | OROMUCOSAL | Status: DC | PRN

## 2018-08-02 MED ORDER — NALBUPHINE HCL 10 MG/ML IJ SOLN: 5.0000 mg | INTRAMUSCULAR | Status: DC | PRN

## 2018-08-02 MED ORDER — LACTATED RINGERS IV SOLN
INTRAVENOUS | Status: DC
  Administered 2018-08-02: 950 mL via INTRAVENOUS

## 2018-08-02 MED ORDER — SENNOSIDES-DOCUSATE SODIUM 8.6-50 MG PO TABS
2.0000 | ORAL_TABLET | Freq: Every evening | ORAL | Status: DC | PRN
  Administered 2018-08-03: 2 via ORAL
  Filled 2018-08-02: qty 2

## 2018-08-02 MED ORDER — IBUPROFEN 600 MG PO TABS
600.0000 mg | ORAL_TABLET | Freq: Four times a day (QID) | ORAL | Status: DC
  Administered 2018-08-02 – 2018-08-05 (×12): 600 mg via ORAL
  Filled 2018-08-02 (×12): qty 1

## 2018-08-02 MED ORDER — EPHEDRINE 5 MG/ML INJ
10.0000 mg | INTRAVENOUS | Status: DC | PRN
  Filled 2018-08-02: qty 2

## 2018-08-02 MED ORDER — ONDANSETRON HCL 4 MG/2ML IJ SOLN
INTRAMUSCULAR | Status: DC | PRN
  Administered 2018-08-02: 4 mg via INTRAVENOUS

## 2018-08-02 MED ORDER — SODIUM CHLORIDE 0.9% FLUSH: 3.0000 mL | INTRAVENOUS | Status: DC | PRN

## 2018-08-02 MED ORDER — SCOPOLAMINE 1 MG/3DAYS TD PT72
MEDICATED_PATCH | TRANSDERMAL | Status: AC
  Filled 2018-08-02: qty 1

## 2018-08-02 MED ORDER — LACTATED RINGERS IV SOLN
INTRAVENOUS | Status: DC | PRN
  Administered 2018-08-02 (×2): via INTRAVENOUS

## 2018-08-02 MED ORDER — DEXAMETHASONE SODIUM PHOSPHATE 10 MG/ML IJ SOLN
INTRAMUSCULAR | Status: AC
  Filled 2018-08-02: qty 1

## 2018-08-02 MED ORDER — ZOLPIDEM TARTRATE 5 MG PO TABS: 5.0000 mg | ORAL_TABLET | Freq: Every evening | ORAL | Status: DC | PRN

## 2018-08-02 MED ORDER — OXYTOCIN 40 UNITS IN LACTATED RINGERS INFUSION - SIMPLE MED: 2.5000 [IU]/h | INTRAVENOUS | Status: AC

## 2018-08-02 MED ORDER — MEPERIDINE HCL 25 MG/ML IJ SOLN: 6.2500 mg | INTRAMUSCULAR | Status: DC | PRN

## 2018-08-02 MED ORDER — OXYCODONE HCL 5 MG PO TABS: 5.0000 mg | ORAL_TABLET | Freq: Once | ORAL | Status: DC | PRN

## 2018-08-02 MED ORDER — SCOPOLAMINE 1 MG/3DAYS TD PT72: 1.0000 | MEDICATED_PATCH | Freq: Once | TRANSDERMAL | Status: DC

## 2018-08-02 MED ORDER — NALOXONE HCL 4 MG/10ML IJ SOLN: 1.0000 ug/kg/h | INTRAVENOUS | Status: DC | PRN

## 2018-08-02 MED ORDER — DIPHENHYDRAMINE HCL 50 MG/ML IJ SOLN: 12.5000 mg | INTRAMUSCULAR | Status: DC | PRN

## 2018-08-02 MED ORDER — OXYCODONE HCL 5 MG PO TABS
5.0000 mg | ORAL_TABLET | Freq: Four times a day (QID) | ORAL | Status: DC | PRN
  Administered 2018-08-02 – 2018-08-05 (×6): 5 mg via ORAL
  Filled 2018-08-02 (×7): qty 1

## 2018-08-02 MED ORDER — SODIUM CHLORIDE 0.9 % IV SOLN
500.0000 mg | Freq: Once | INTRAVENOUS | Status: AC
  Administered 2018-08-02: 500 mg via INTRAVENOUS
  Filled 2018-08-02: qty 500

## 2018-08-02 MED ORDER — CEFAZOLIN SODIUM-DEXTROSE 2-4 GM/100ML-% IV SOLN
2.0000 g | INTRAVENOUS | Status: AC
  Administered 2018-08-02: 2 g via INTRAVENOUS

## 2018-08-02 MED ORDER — OXYTOCIN 10 UNIT/ML IJ SOLN
INTRAMUSCULAR | Status: AC
  Filled 2018-08-02: qty 4

## 2018-08-02 MED ORDER — SIMETHICONE 80 MG PO CHEW
80.0000 mg | CHEWABLE_TABLET | Freq: Three times a day (TID) | ORAL | Status: DC
  Administered 2018-08-03 – 2018-08-05 (×8): 80 mg via ORAL
  Filled 2018-08-02 (×9): qty 1

## 2018-08-02 MED ORDER — HYDROMORPHONE HCL 1 MG/ML IJ SOLN: 0.2500 mg | INTRAMUSCULAR | Status: DC | PRN

## 2018-08-02 MED ORDER — OXYTOCIN 10 UNIT/ML IJ SOLN
INTRAVENOUS | Status: DC | PRN
  Administered 2018-08-02: 40 [IU] via INTRAVENOUS

## 2018-08-02 MED ORDER — LACTATED RINGERS IV SOLN
INTRAVENOUS | Status: DC | PRN
  Administered 2018-08-02: 13:00:00 via INTRAVENOUS

## 2018-08-02 MED ORDER — DIBUCAINE 1 % RE OINT: 1.0000 "application " | TOPICAL_OINTMENT | RECTAL | Status: DC | PRN

## 2018-08-02 MED ORDER — PHENYLEPHRINE 40 MCG/ML (10ML) SYRINGE FOR IV PUSH (FOR BLOOD PRESSURE SUPPORT)
PREFILLED_SYRINGE | INTRAVENOUS | Status: AC
  Filled 2018-08-02: qty 20

## 2018-08-02 MED ORDER — ENOXAPARIN SODIUM 40 MG/0.4ML ~~LOC~~ SOLN: 40.0000 mg | SUBCUTANEOUS | Status: DC

## 2018-08-02 MED ORDER — LACTATED RINGERS IV SOLN
500.0000 mL | Freq: Once | INTRAVENOUS | Status: AC
  Administered 2018-08-02: 500 mL via INTRAVENOUS

## 2018-08-02 MED ORDER — SOD CITRATE-CITRIC ACID 500-334 MG/5ML PO SOLN: 30.0000 mL | ORAL | Status: DC

## 2018-08-02 MED ORDER — OXYCODONE HCL 5 MG/5ML PO SOLN: 5.0000 mg | Freq: Once | ORAL | Status: DC | PRN

## 2018-08-02 MED ORDER — DIPHENHYDRAMINE HCL 25 MG PO CAPS: 25.0000 mg | ORAL_CAPSULE | Freq: Four times a day (QID) | ORAL | Status: DC | PRN

## 2018-08-02 MED ORDER — DIPHENHYDRAMINE HCL 25 MG PO CAPS: 25.0000 mg | ORAL_CAPSULE | ORAL | Status: DC | PRN

## 2018-08-02 MED ORDER — NALBUPHINE HCL 10 MG/ML IJ SOLN: 5.0000 mg | Freq: Once | INTRAMUSCULAR | Status: DC | PRN

## 2018-08-02 MED ORDER — PHENYLEPHRINE 40 MCG/ML (10ML) SYRINGE FOR IV PUSH (FOR BLOOD PRESSURE SUPPORT)
PREFILLED_SYRINGE | INTRAVENOUS | Status: DC | PRN
  Administered 2018-08-02 (×2): 80 ug via INTRAVENOUS

## 2018-08-02 MED ORDER — PROMETHAZINE HCL 25 MG/ML IJ SOLN: 6.2500 mg | INTRAMUSCULAR | Status: DC | PRN

## 2018-08-02 MED ORDER — NALOXONE HCL 0.4 MG/ML IJ SOLN: 0.4000 mg | INTRAMUSCULAR | Status: DC | PRN

## 2018-08-02 MED ORDER — PHENYLEPHRINE 40 MCG/ML (10ML) SYRINGE FOR IV PUSH (FOR BLOOD PRESSURE SUPPORT)
80.0000 ug | PREFILLED_SYRINGE | INTRAVENOUS | Status: DC | PRN
  Filled 2018-08-02: qty 5

## 2018-08-02 MED ORDER — SCOPOLAMINE 1 MG/3DAYS TD PT72
MEDICATED_PATCH | TRANSDERMAL | Status: DC | PRN
  Administered 2018-08-02: 1 via TRANSDERMAL

## 2018-08-02 MED ORDER — KETOROLAC TROMETHAMINE 30 MG/ML IJ SOLN: 30.0000 mg | Freq: Once | INTRAMUSCULAR | Status: DC | PRN

## 2018-08-02 MED ORDER — ONDANSETRON HCL 4 MG/2ML IJ SOLN: 4.0000 mg | Freq: Three times a day (TID) | INTRAMUSCULAR | Status: DC | PRN

## 2018-08-02 MED ORDER — OXYCODONE HCL 5 MG PO TABS: 10.0000 mg | ORAL_TABLET | ORAL | Status: DC | PRN

## 2018-08-02 MED ORDER — TETANUS-DIPHTH-ACELL PERTUSSIS 5-2.5-18.5 LF-MCG/0.5 IM SUSP: 0.5000 mL | Freq: Once | INTRAMUSCULAR | Status: DC

## 2018-08-02 MED ORDER — LIDOCAINE HCL (PF) 1 % IJ SOLN
INTRAMUSCULAR | Status: DC | PRN
  Administered 2018-08-02 (×2): 6 mL via EPIDURAL

## 2018-08-02 MED ORDER — FENTANYL 2.5 MCG/ML BUPIVACAINE 1/10 % EPIDURAL INFUSION (WH - ANES)
14.0000 mL/h | INTRAMUSCULAR | Status: DC | PRN
  Administered 2018-08-02: 14 mL/h via EPIDURAL

## 2018-08-02 MED ORDER — MORPHINE SULFATE (PF) 0.5 MG/ML IJ SOLN
INTRAMUSCULAR | Status: AC
  Filled 2018-08-02: qty 10

## 2018-08-02 MED ORDER — FENTANYL 2.5 MCG/ML BUPIVACAINE 1/10 % EPIDURAL INFUSION (WH - ANES)
INTRAMUSCULAR | Status: AC
  Filled 2018-08-02: qty 100

## 2018-08-02 MED ORDER — ONDANSETRON HCL 4 MG/2ML IJ SOLN
INTRAMUSCULAR | Status: AC
  Filled 2018-08-02: qty 2

## 2018-08-02 MED ORDER — COCONUT OIL OIL
1.0000 "application " | TOPICAL_OIL | Status: DC | PRN
  Administered 2018-08-04: 1 via TOPICAL
  Filled 2018-08-02: qty 120

## 2018-08-02 MED ORDER — WITCH HAZEL-GLYCERIN EX PADS: 1.0000 "application " | MEDICATED_PAD | CUTANEOUS | Status: DC | PRN

## 2018-08-02 MED ORDER — POLYETHYLENE GLYCOL 3350 17 G PO PACK
17.0000 g | PACK | Freq: Every day | ORAL | Status: DC
  Administered 2018-08-03 – 2018-08-05 (×3): 17 g via ORAL
  Filled 2018-08-02 (×5): qty 1

## 2018-08-02 MED ORDER — DEXAMETHASONE SODIUM PHOSPHATE 10 MG/ML IJ SOLN
INTRAMUSCULAR | Status: DC | PRN
  Administered 2018-08-02: 10 mg via INTRAVENOUS

## 2018-08-02 MED ORDER — ACETAMINOPHEN 325 MG PO TABS
650.0000 mg | ORAL_TABLET | ORAL | Status: DC | PRN
  Administered 2018-08-03 – 2018-08-05 (×6): 650 mg via ORAL
  Filled 2018-08-02 (×7): qty 2

## 2018-08-02 MED ORDER — MORPHINE SULFATE (PF) 0.5 MG/ML IJ SOLN
INTRAMUSCULAR | Status: DC | PRN
  Administered 2018-08-02: 2 mg via EPIDURAL

## 2018-08-02 MED ORDER — SODIUM BICARBONATE 8.4 % IV SOLN
INTRAVENOUS | Status: DC | PRN
  Administered 2018-08-02 (×2): 5 mL via EPIDURAL

## 2018-08-02 SURGICAL SUPPLY — 36 items
BENZOIN TINCTURE PRP APPL 2/3 (GAUZE/BANDAGES/DRESSINGS) ×3 IMPLANT
CANISTER SUCT 3000ML PPV (MISCELLANEOUS) ×3 IMPLANT
CHLORAPREP W/TINT 26ML (MISCELLANEOUS) ×3 IMPLANT
CLOSURE WOUND 1/2 X4 (GAUZE/BANDAGES/DRESSINGS) ×1
DERMABOND ADVANCED (GAUZE/BANDAGES/DRESSINGS) ×2
DERMABOND ADVANCED .7 DNX12 (GAUZE/BANDAGES/DRESSINGS) ×1 IMPLANT
DRSG OPSITE POSTOP 4X10 (GAUZE/BANDAGES/DRESSINGS) ×3 IMPLANT
ELECT REM PT RETURN 9FT ADLT (ELECTROSURGICAL) ×3
ELECTRODE REM PT RTRN 9FT ADLT (ELECTROSURGICAL) ×1 IMPLANT
EXTRACTOR VACUUM KIWI (MISCELLANEOUS) IMPLANT
GLOVE BIOGEL PI IND STRL 7.0 (GLOVE) ×2 IMPLANT
GLOVE BIOGEL PI IND STRL 7.5 (GLOVE) ×3 IMPLANT
GLOVE BIOGEL PI INDICATOR 7.0 (GLOVE) ×4
GLOVE BIOGEL PI INDICATOR 7.5 (GLOVE) ×6
GLOVE SKINSENSE NS SZ7.0 (GLOVE) ×6
GLOVE SKINSENSE STRL SZ7.0 (GLOVE) ×3 IMPLANT
GOWN STRL REUS W/ TWL LRG LVL3 (GOWN DISPOSABLE) ×2 IMPLANT
GOWN STRL REUS W/ TWL XL LVL3 (GOWN DISPOSABLE) ×2 IMPLANT
GOWN STRL REUS W/TWL LRG LVL3 (GOWN DISPOSABLE) ×4
GOWN STRL REUS W/TWL XL LVL3 (GOWN DISPOSABLE) ×4
NS IRRIG 1000ML POUR BTL (IV SOLUTION) ×3 IMPLANT
PACK C SECTION WH (CUSTOM PROCEDURE TRAY) ×3 IMPLANT
PAD ABD 7.5X8 STRL (GAUZE/BANDAGES/DRESSINGS) ×3 IMPLANT
PAD OB MATERNITY 4.3X12.25 (PERSONAL CARE ITEMS) ×3 IMPLANT
PAD PREP 24X48 CUFFED NSTRL (MISCELLANEOUS) ×3 IMPLANT
PENCIL SMOKE EVAC W/HOLSTER (ELECTROSURGICAL) ×3 IMPLANT
SPONGE LAP 18X18 X RAY DECT (DISPOSABLE) ×6 IMPLANT
STRIP CLOSURE SKIN 1/2X4 (GAUZE/BANDAGES/DRESSINGS) ×2 IMPLANT
SUT MNCRL 0 VIOLET CTX 36 (SUTURE) ×2 IMPLANT
SUT MON AB 4-0 PS1 27 (SUTURE) ×3 IMPLANT
SUT MONOCRYL 0 CTX 36 (SUTURE) ×4
SUT PLAIN 2 0 XLH (SUTURE) ×3 IMPLANT
SUT VIC AB 0 CT1 36 (SUTURE) ×6 IMPLANT
SUT VIC AB 3-0 CT1 27 (SUTURE) ×2
SUT VIC AB 3-0 CT1 TAPERPNT 27 (SUTURE) ×1 IMPLANT
TOWEL OR 17X24 6PK STRL BLUE (TOWEL DISPOSABLE) ×6 IMPLANT

## 2018-08-02 NOTE — Anesthesia Procedure Notes (Deleted)
Epidural Patient location during procedure: OB Start time: 08/02/2018 5:24 AM End time: 08/02/2018 5:27 AM  Staffing Anesthesiologist: Leilani Able, MD Performed: anesthesiologist   Preanesthetic Checklist Completed: patient identified, site marked, surgical consent, pre-op evaluation, timeout performed, IV checked, risks and benefits discussed and monitors and equipment checked  Epidural Patient position: sitting Prep: site prepped and draped and DuraPrep Patient monitoring: continuous pulse ox and blood pressure Approach: midline Location: L3-L4 Injection technique: LOR air  Needle:  Needle type: Tuohy  Needle gauge: 17 G Needle length: 9 cm and 9 Needle insertion depth: 6 cm Catheter type: closed end flexible Catheter size: 19 Gauge Catheter at skin depth: 11 cm Test dose: negative and Other  Assessment Sensory level: T9 Events: blood not aspirated, injection not painful, no injection resistance, negative IV test and no paresthesia

## 2018-08-02 NOTE — Anesthesia Procedure Notes (Signed)
Epidural Patient location during procedure: OB Start time: 08/02/2018 5:34 AM End time: 08/02/2018 5:37 AM  Staffing Anesthesiologist: Leilani Able, MD Performed: anesthesiologist   Preanesthetic Checklist Completed: patient identified, site marked, surgical consent, pre-op evaluation, timeout performed, IV checked, risks and benefits discussed and monitors and equipment checked  Epidural Patient position: sitting Prep: site prepped and draped and DuraPrep Patient monitoring: continuous pulse ox and blood pressure Approach: midline Location: L3-L4 Injection technique: LOR air  Needle:  Needle type: Tuohy  Needle gauge: 17 G Needle length: 9 cm and 9 Needle insertion depth: 6 cm Catheter type: closed end flexible Catheter size: 19 Gauge Catheter at skin depth: 11 cm Test dose: negative and Other  Assessment Sensory level: T9 Events: blood not aspirated, injection not painful, no injection resistance, negative IV test and no paresthesia  Additional Notes Reason for block:procedure for pain

## 2018-08-02 NOTE — Op Note (Signed)
Operative Note   SURGERY DATE: 08/02/2018  PRE-OP DIAGNOSIS:  *Pregnancy at 41/1 *Failed induction of labor for post dates *Fetal intolerance of labor at 5-6cm  POST-OP DIAGNOSIS: Same. delivered   PROCEDURE: primary low transverse cesarean section via pfannenstiel skin incision with double layer uterine closure  SURGEON: Surgeon(s) and Role:    [place] Bing, MD - Primary  ASSISTANT: None  ANESTHESIA: epidural  ESTIMATED BLOOD LOSS:  DRAINS: UOP via indwelling foley  TOTAL IV FLUIDS: crystalloid  VTE PROPHYLAXIS: SCDs to bilateral lower extremities  ANTIBIOTICS: Two grams of Cefazolin were given., within 1 hour of skin incision and azithromycin 500mg  IV x 1 given intra op  SPECIMENS: placenta to pathology  COMPLICATIONS: None  INDICATIONS: Unable to augment with pitocin due to fetal decelerations. No cervical change on pitocin since 2100 on 07/31/2018  FINDINGS: No intra-abdominal adhesions were noted. Grossly normal uterus, tubes and ovaries. Clear amniotic fluid, cephalic, moderate caput female infant, weight 3525gm, APGARs 9/10, intact placenta.  PROCEDURE IN DETAIL: The patient was taken to the operating room where anesthesia was administered and normal fetal heart tones were confirmed. She was then prepped and draped in the normal fashion in the dorsal supine position with a leftward tilt.  After a time out was performed, a pfannensteil skin incision was made with the scalpel and carried through to the underlying layer of fascia. The fascia was then incised at the midline and this incision was extended laterally with the mayo scissors. Attention was turned to the superior aspect of the fascial incision which was grasped with the kocher clamps x 2, tented up and the rectus muscles were dissected off with the bovie. In a similar fashion the inferior aspect of the fascial incision was grasped with the kocher clamps, tented up and the rectus muscles dissected  off with the mayo scissors. The rectus muscles were then separated in the midline and the peritoneum was entered bluntly. The bladder blade was inserted and the vesicouterine peritoneum was identified, tented up and entered with the metzenbaum scissors. This incision was extended laterally and the bladder flap was created digitally. The bladder blade was reinserted.  A low transverse hysterotomy was made with the scalpel until the endometrial cavity was breached and the amniotic sac ruptured, yielding clear amniotic fluid. This incision was extended bluntly and the infant's head, shoulders and body were delivered atraumatically.The cord was clamped x 2 and cut, and the infant was handed to the awaiting pediatricians, after delayed cord clamping was done.  The placenta was then gradually expressed from the uterus and then the uterus was exteriorized and cleared of all clots and debris. The hysterotomy was repaired with a running suture of 1-0 monocryl. A second imbricating layer of 1-0 monocryl suture was then placed  to achieve excellent hemostasis.   The uterus and adnexa were then returned to the abdomen, and the hysterotomy and all operative sites were reinspected and excellent hemostasis was noted after irrigation and suction of the abdomen with warm saline.  The peritoneum was closed with a running stitch of 3-0 Vicryl. The fascia was reapproximated with 0 Vicryl in a simple running fashion bilaterally. The subcutaneous layer was then reapproximated with interrupted sutures of 2-0 plain gut, and the skin was then closed with 4-0 monocryl, in a subcuticular fashion.  The patient  tolerated the procedure well. Sponge, lap, needle, and instrument counts were correct x 2. The patient was transferred to the recovery room awake, alert and breathing  independently in stable condition.  Cornelia Copa MD Attending Center for [facility])

## 2018-08-02 NOTE — Consult Note (Signed)
Neonatology Note:   Attendance at C-section:    I was asked by Dr. Vergie Living to attend this primary C/S at 41 weeks due to [facility] and NRFHR. The mother is a G1P0 A pos, GBS neg with an uncomplicated pregnancy. ROM 8 hours prior to delivery, fluid clear. Infant vigorous with good spontaneous cry and tone. Delayed cord clamping was done. Needed only minimal bulb suctioning. Ap 9/10. Lungs clear to ausc in DR. Infant is able to remain with his mother for skin to skin time under nursing supervision. Transferred to the care of Pediatrician.   Doretha Sou, MD

## 2018-08-02 NOTE — Transfer of Care (Signed)
Immediate Anesthesia Transfer of Care Note  Patient: Anna Choi  Procedure(s) Performed: CESAREAN SECTION (N/A )  Patient Location: PACU  Anesthesia Type:Epidural  Level of Consciousness: awake, alert  and oriented  Airway & Oxygen Therapy: Patient Spontanous Breathing  Post-op Assessment: Report given to RN and Post -op Vital signs reviewed and stable  Post vital signs: Reviewed and stable  Last Vitals:  Vitals Value Taken Time  BP    Temp    Pulse    Resp    SpO2      Last Pain:  Vitals:   08/02/18 1031  TempSrc:   PainSc: 2          Complications: No apparent anesthesia complications

## 2018-08-02 NOTE — Anesthesia Postprocedure Evaluation (Signed)
Anesthesia Post Note  Patient: Anna Choi  Procedure(s) Performed: CESAREAN SECTION (N/A )     Patient location during evaluation: Mother Baby Anesthesia Type: Epidural Level of consciousness: awake and alert Pain management: pain level controlled Vital Signs Assessment: post-procedure vital signs reviewed and stable Respiratory status: spontaneous breathing, nonlabored ventilation and respiratory function stable Cardiovascular status: stable Postop Assessment: no headache, no backache and epidural receding Anesthetic complications: no    Last Vitals:  Vitals:   08/02/18 1512 08/02/18 1525  BP: 101/60 113/66  Pulse: (!) 101 (!) 104  Resp: 19 18  Temp: 37 C 37.1 C  SpO2: 100% 100%    Last Pain:  Vitals:   08/02/18 1525  TempSrc: Oral  PainSc: 0-No pain   Pain Goal:                 Lowella Curb

## 2018-08-02 NOTE — Anesthesia Preprocedure Evaluation (Signed)
Anesthesia Evaluation  Patient identified by MRN, date of birth, ID band Patient awake    Reviewed: Allergy & Precautions, H&P , NPO status , Patient's Chart, lab work & pertinent test results  Airway Mallampati: II  TM Distance: >3 FB Neck ROM: full    Dental no notable dental hx. (+) Teeth Intact   Pulmonary    Pulmonary exam normal breath sounds clear to auscultation       Cardiovascular negative cardio ROS Normal cardiovascular exam Rhythm:regular Rate:Normal     Neuro/Psych negative neurological ROS  negative psych ROS   GI/Hepatic Neg liver ROS,   Endo/Other  Morbid obesity  Renal/GU negative Renal ROS     Musculoskeletal   Abdominal (+) + obese,   Peds  Hematology negative hematology ROS (+)   Anesthesia Other Findings   Reproductive/Obstetrics (+) Pregnancy                             Anesthesia Physical Anesthesia Plan  ASA: III  Anesthesia Plan: Epidural   Post-op Pain Management:    Induction:   PONV Risk Score and Plan:   Airway Management Planned:   Additional Equipment:   Intra-op Plan:   Post-operative Plan:   Informed Consent: I have reviewed the patients History and Physical, chart, labs and discussed the procedure including the risks, benefits and alternatives for the proposed anesthesia with the patient or authorized representative who has indicated his/her understanding and acceptance.     Plan Discussed with:   Anesthesia Plan Comments:         Anesthesia Quick Evaluation

## 2018-08-02 NOTE — Progress Notes (Signed)
L&D Note  08/02/2018 - 12:06 PM  21 y.o. G1P0 [redacted]w[redacted]d. Pregnancy complicated by:  Patient Active Problem List   Diagnosis Date Noted  . Post-dates pregnancy 08/01/2018  . GERD (gastroesophageal reflux disease) 05/07/2018  . 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 Asthma 02/11/2018  . Low grade squamous intraepith lesion on cytologic smear cervix (lgsil) 02/11/2018  . Supervision of normal first pregnancy, antepartum 01/21/2018    [name] is admitted for PDIOL   Subjective:  Comfortable with epidural in place  Objective:   Vitals:   08/02/18 1031 08/02/18 1101 08/02/18 1131 08/02/18 1201  BP: (!) 146/65 134/74 (!) 104/54 (!) 74/60  Pulse: 100 100 (!) 101 (!) 134  Resp: 18 18 18 18   Temp:      TempSrc:      Weight:      Height:        Current Vital Signs 24h Vital Sign Ranges  T 98.3 F (36.8 C) Temp  Avg: 98.4 F (36.9 C)  Min: 98 F (36.7 C)  Max: 98.9 F (37.2 C)  BP (!) 74/60 BP  Min: 74/60  Max: 146/65  HR (!) 134 Pulse  Avg: 97.6  Min: 82  Max: 134  RR 18 Resp  Avg: 17.9  Min: 16  Max: 18  SaO2     No data recorded       24 Hour I/O Current Shift I/O  Time Ins Outs No intake/output data recorded. No intake/output data recorded.   Vitals:   08/02/18 1031 08/02/18 1101 08/02/18 1131 08/02/18 1201  BP: (!) 146/65 134/74 (!) 104/54 (!) 74/60  Pulse: 100 100 (!) 101 (!) 134  Resp: 18 18 18 18   Temp:      TempSrc:      Weight:      Height:        Temp:  [98 F (36.7 C)-98.9 F (37.2 C)] 98.3 F (36.8 C) (09/08 0931) Pulse Rate:  [82-134] 134 (09/08 1201) Resp:  [16-18] 18 (09/08 1201) BP: (74-146)/(52-89) 74/60 (09/08 1201) No intake/output data recorded. No intake/output data recorded. No intake or output data in the 24 hours ending 08/02/18 1207   Current Vital Signs 24h Vital Sign Ranges  T 98.3 F (36.8 C) Temp  Avg: 98.4 F (36.9 C)  Min: 98 F (36.7 C)  Max: 98.9 F (37.2 C)  BP (!) 74/60 BP  Min: 74/60  Max: 146/65  HR (!) 134  Pulse  Avg: 97.6  Min: 82  Max: 134  RR 18 Resp  Avg: 17.9  Min: 16  Max: 18  SaO2     No data recorded       24 Hour I/O Current Shift I/O  Time Ins Outs No intake/output data recorded. No intake/output data recorded.  Patient lying with cuff side up  Patient Vitals for the past 6 hrs:  BP Temp Temp src Pulse Resp  08/02/18 1201 (!) 74/60 - - (!) 134 18  08/02/18 1131 (!) 104/54 - - (!) 101 18  08/02/18 1101 134/74 - - 100 18  08/02/18 1031 (!) 146/65 - - 100 18  08/02/18 1001 120/65 - - (!) 103 18  08/02/18 0931 112/74 98.3 F (36.8 C) Oral 100 18  08/02/18 0901 100/78 - - 97 18  08/02/18 0831 107/61 - - 90 18  08/02/18 0801 (!) 100/59 - - 85 18  08/02/18 0731 109/74 - - 97 18  08/02/18 0701 118/64 98.4 F (36.9 C) Oral 95 18  08/02/18 0631 119/63 - - 89 18      FHR: 140 baseline, no accels, neg scalp stim, +lates and variables, mod variability Toco: q2-57m Gen: NAD SVE: 5-6/50/high  Labs:  Recent Labs  Lab 08/01/18 0900  WBC 10.9*  HGB 12.7  HCT 37.9  PLT 206   No results for input(s): NA, K, CL, CO2, BUN, CREATININE, CALCIUM, PROT, BILITOT, ALKPHOS, ALT, AST, GLUCOSE in the last 168 hours.  Invalid input(s): LABALBU  Medications Current Facility-Administered Medications  Medication Dose Route Frequency Provider Last Rate Last Dose  . acetaminophen (TYLENOL) tablet 650 mg  650 mg Oral Q4H PRN Judeth Horn, NP      . diphenhydrAMINE (BENADRYL) injection 12.5 mg  12.5 mg Intravenous Q15 min PRN Lowella Curb, MD      . ePHEDrine injection 10 mg  10 mg Intravenous PRN Lowella Curb, MD      . ePHEDrine injection 10 mg  10 mg Intravenous PRN Lowella Curb, MD      . fentaNYL (SUBLIMAZE) injection 100 mcg  100 mcg Intravenous Q1H PRN Arvilla Market, DO   100 mcg at 08/02/18 0414  . fentaNYL 2.5 mcg/ml w/bupivacaine 0.1% in NS epidural infusion (WH-ANES)  14 mL/hr Epidural Continuous PRN Lowella Curb, MD 14 mL/hr at 08/02/18  0541 14 mL/hr at 08/02/18 0541  . lactated ringers infusion 500-1,000 mL  500-1,000 mL Intravenous PRN Judeth Horn, NP 999 mL/hr at 08/02/18 0302 500 mL at 08/02/18 0302  . lactated ringers infusion   Intravenous Continuous Judeth Horn, NP 125 mL/hr at 08/02/18 0856    . lidocaine (PF) (XYLOCAINE) 1 % injection 30 mL  30 mL Subcutaneous PRN Judeth Horn, NP      . misoprostol (CYTOTEC) tablet 25 mcg  25 mcg Oral Q4H Leftwich-Kirby, Lisa A, CNM   25 mcg at 08/01/18 1518  . ondansetron (ZOFRAN) injection 4 mg  4 mg Intravenous Q6H PRN Judeth Horn, NP      . oxyCODONE-acetaminophen (PERCOCET/ROXICET) 5-325 MG per tablet 1 tablet  1 tablet Oral Q4H PRN Judeth Horn, NP      . oxyCODONE-acetaminophen (PERCOCET/ROXICET) 5-325 MG per tablet 2 tablet  2 tablet Oral Q4H PRN Judeth Horn, NP      . oxytocin (PITOCIN) IV BOLUS FROM BAG  500 mL Intravenous Once Judeth Horn, NP      . oxytocin (PITOCIN) IV infusion 40 units in LR 1000 mL - Premix  2.5 Units/hr Intravenous Continuous Judeth Horn, NP      . oxytocin (PITOCIN) IV infusion 40 units in LR 1000 mL - Premix  1-40 milli-units/min Intravenous Titrated Denzil Hughes, MD   Stopped at 08/02/18 1130  . phenylephrine 0.4-0.9 MG/10ML-% injection           . PHENYLephrine 40 mcg/ml in normal saline Adult IV Push Syringe  80 mcg Intravenous PRN Lowella Curb, MD      . PHENYLephrine 40 mcg/ml in normal saline Adult IV Push Syringe  80 mcg Intravenous PRN Lowella Curb, MD      . sodium citrate-citric acid (ORACIT) solution 30 mL  30 mL Oral Q2H PRN Judeth Horn, NP      . sodium phosphate (FLEET) 7-19 GM/118ML enema 1 enema  1 enema Rectal PRN Judeth Horn, NP      . terbutaline (BRETHINE) injection 0.25 mg  0.25 mg Subcutaneous Once PRN Lorenza Burton D, MD      . terbutaline (BRETHINE) injection 0.25 mg  0.25 mg Subcutaneous Once PRN Denzil Hughes, MD       Facility-Administered Medications Ordered in Other Encounters   Medication Dose Route Frequency Provider Last Rate Last Dose  . lidocaine (PF) (XYLOCAINE) 1 % injection    Anesthesia Intra-op Leilani Able, MD   6 mL at 08/02/18 0539    Assessment & Plan:  Pt stable *IUP: category II *Labor: unfortunately lack of cx change and with pitocin off and UCs less frequent and less strong but still having decels. Given this, I told her I recommend pLTCS for FITL which she is amenable to. Will do azithro and ancef.  *GBS: neg *Analgesia: epidural working well.   Cornelia Copa MD Attending Center for [facility])

## 2018-08-02 NOTE — Progress Notes (Signed)
Labor Progress Note Anna Choi is a 22 y.o. G1P0 at [redacted]w[redacted]d presented for  induction of labor due topostdates. S: doing well, on fentanyl prn.  O:  BP (!) 106/52   Pulse 91   Temp 98 F (36.7 C) (Oral)   Resp 16   Ht 5\' 3"  (1.6 m)   Wt 103.5 kg   LMP 10/18/2017 (Exact Date)   BMI 40.42 kg/m  EFM: 130/mod var/+accels  CVE: Dilation: 6 Effacement (%): 60 Cervical Position: Posterior Station: -2 Presentation: Vertex Exam by:: Dr. Fara Boros    A&P: 22 y.o. G1P0 [redacted]w[redacted]d for IOL 2/2 PD #Labor: Progressing well. S/p cytotec x2. Foley bulb fell out 2100. On Pit 10. AROM 0450 w/ clear fluid. Placed IUPC. #Pain: IV fentanyl prn #FWB: Cat 1 FHT #GBS negative   Denzil Hughes, MD 5:03 AM

## 2018-08-02 NOTE — Progress Notes (Signed)
Cordy Goll is a 22 y.o. G1P0 at [redacted]w[redacted]d by ultrasound admitted for induction of labor due to Post dates. Due date 07/26/18.  Subjective:   Objective: BP 120/65   Pulse (!) 103   Temp 98.3 F (36.8 C) (Oral)   Resp 18   Ht 5\' 3"  (1.6 m)   Wt 103.5 kg   LMP 10/18/2017 (Exact Date)   BMI 40.42 kg/m  No intake/output data recorded. No intake/output data recorded.  FHT:  FHR: 140's bpm, variability: moderate,  accelerations:  Abscent,  decelerations:  Present variable UC:   regular, every 3-4 minutes SVE:   Dilation: 6 Effacement (%): 100 Station: 0 Exam by:: Marlynn Perking, CNM  Labs: Lab Results  Component Value Date   WBC 10.9 (H) 08/01/2018   HGB 12.7 08/01/2018   HCT 37.9 08/01/2018   MCV 90.7 08/01/2018   PLT 206 08/01/2018    Assessment / Plan: Induction of labor due to postterm,  progressing well on pitocin  Labor: slow progress Preeclampsia:  no signs or symptoms of toxicity Fetal Wellbeing:  Category II Pain Control:  Nitrous Oxide I/D:  n/a Anticipated MOD:  NSVD  Wyvonnia Dusky 08/02/2018, 10:49 AM

## 2018-08-03 ENCOUNTER — Encounter: Payer: Self-pay | Admitting: *Deleted

## 2018-08-03 LAB — CBC
HEMATOCRIT: 25.7 % — AB (ref 36.0–46.0)
Hemoglobin: 8.9 g/dL — ABNORMAL LOW (ref 12.0–15.0)
MCH: 31.2 pg (ref 26.0–34.0)
MCHC: 34.6 g/dL (ref 30.0–36.0)
MCV: 90.2 fL (ref 78.0–100.0)
Platelets: 189 10*3/uL (ref 150–400)
RBC: 2.85 MIL/uL — AB (ref 3.87–5.11)
RDW: 13.2 % (ref 11.5–15.5)
WBC: 15.7 10*3/uL — ABNORMAL HIGH (ref 4.0–10.5)

## 2018-08-03 LAB — CREATININE, SERUM
Creatinine, Ser: 0.55 mg/dL (ref 0.44–1.00)
GFR calc non Af Amer: >60 mL/min (ref >60)

## 2018-08-03 MED ORDER — ENOXAPARIN SODIUM 60 MG/0.6ML ~~LOC~~ SOLN
50.0000 mg | SUBCUTANEOUS | Status: DC
  Administered 2018-08-03 – 2018-08-04 (×2): 50 mg via SUBCUTANEOUS
  Filled 2018-08-03 (×3): qty 0.6

## 2018-08-03 NOTE — Lactation Note (Signed)
This note was copied from a baby's chart. Lactation Consultation Note  Patient Name: Anna Choi Date: 08/03/2018 Reason for consult: Initial assessment;Difficult latch;Primapara;Term Baby is 37 hours old and feeding well per mom with a 24 mm nipple shield.  Baby just had bath and is sleeping skin to skin with mom.  Discussed pumping with symphony pump 4-6 times per day to establish and maintain a good milk supply.  Pump set up and instructed on use.  She will initiate pumping after skin to skin.  Instructed to feed with cues and call for assist prn.  Maternal Data    Feeding Feeding Type: Breast Fed Length of feed: 22 min  LATCH Score Latch: Grasps breast easily, tongue down, lips flanged, rhythmical sucking.  Audible Swallowing: A few with stimulation  Type of Nipple: Everted at rest and after stimulation  Comfort (Breast/Nipple): Soft / non-tender  Hold (Positioning): Assistance needed to correctly position infant at breast and maintain latch.  LATCH Score: 8  Interventions    Lactation Tools Discussed/Used Nipple shield size: 24 Pump Review: Setup, frequency, and cleaning;Milk Storage Initiated by:: LM Date initiated:: 08/03/18   Consult Status Consult Status: Follow-up Date: 08/04/18 Follow-up type: In-patient    Huston Foley 08/03/2018, 11:25 AM

## 2018-08-03 NOTE — Progress Notes (Signed)
Subjective: Postpartum Day 1: Cesarean Delivery 08/02/18 @ 1252 Patient reports tolerating PO, + flatus and no problems voiding.    Objective: Vital signs in last 24 hours: Temp:  [98 F (36.7 C)-99.1 F (37.3 C)] 98 F (36.7 C) (09/09 0430) Pulse Rate:  [70-135] 70 (09/09 0430) Resp:  [15-23] 18 (09/08 2300) BP: (74-146)/(45-86) 100/45 (09/09 0430) SpO2:  [93 %-100 %] 99 % (09/09 0430)  Physical Exam:  General: alert, cooperative, appears stated age and no distress Lochia: appropriate Uterine Fundus: firm Incision: old drainage on honeycomb dressing, outline marked with sharpie DVT Evaluation: No evidence of DVT seen on physical exam.  Recent Labs    08/01/18 0900  HGB 12.7  HCT 37.9    Assessment/Plan: Status post Cesarean section. Doing well postoperatively.  Continue current care.  Calvert Cantor , CNM 08/03/2018, 6:19 AM

## 2018-08-03 NOTE — Addendum Note (Signed)
Addendum  created 08/03/18 0933 by Algis Greenhouse, CRNA   Sign clinical note

## 2018-08-03 NOTE — Anesthesia Postprocedure Evaluation (Signed)
Anesthesia Post Note  Patient: Anna Choi  Procedure(s) Performed: CESAREAN SECTION (N/A )     Patient location during evaluation: Mother Baby Anesthesia Type: Epidural Level of consciousness: awake Pain management: satisfactory to patient Vital Signs Assessment: post-procedure vital signs reviewed and stable Respiratory status: spontaneous breathing Cardiovascular status: stable Anesthetic complications: no    Last Vitals:  Vitals:   08/03/18 0430 08/03/18 0915  BP: (!) 100/45 (!) 107/44  Pulse: 70 90  Resp:  18  Temp: 36.7 C 36.5 C  SpO2: 99% 100%    Last Pain:  Vitals:   08/03/18 0915  TempSrc: Oral  PainSc: 2    Pain Goal:                 [employer]

## 2018-08-04 MED ORDER — FERROUS SULFATE 325 (65 FE) MG PO TABS
325.0000 mg | ORAL_TABLET | Freq: Two times a day (BID) | ORAL | Status: DC
  Administered 2018-08-05: 325 mg via ORAL
  Filled 2018-08-04: qty 1

## 2018-08-04 NOTE — Progress Notes (Signed)
Subjective: Postop Day 2 : Cesarean Delivery Patient reports no complaints. Has been up walking without difficulty. Pain relatively well-controlled.  No lightheadedness or dizziness.   Objective: Vital signs in last 24 hours: Temp:  [97.9 F (36.6 C)-98.1 F (36.7 C)] 98 F (36.7 C) (09/10 1452) Pulse Rate:  [81-101] 92 (09/10 1452) Resp:  [16] 16 (09/10 1452) BP: (104-112)/(53-67) 112/53 (09/10 1452) SpO2:  [98 %-100 %] 98 % (09/10 1452)  Physical Exam:  General: NAD, sitting up in bed Lochia: appropriate Uterine Fundus: firm Incision: no significant drainage, dressing dry and intact DVT Evaluation: 1+ bilateral pitting edema  Recent Labs    08/03/18 0608  HGB 8.9*  HCT 25.7*    Assessment/Plan: Status post Cesarean section. Doing well postoperatively.  Continue current care. Plan to discharge tomorrow.  Planning for condoms.  PPH with EBL of 770cc - will start ferrous sulfate and recheck H/H in morning  Continue DVT ppx.   Tamera Stands 08/04/2018, 5:22 PM

## 2018-08-05 ENCOUNTER — Telehealth: Payer: Self-pay | Admitting: General Practice

## 2018-08-05 LAB — HEMOGLOBIN AND HEMATOCRIT, BLOOD
HCT: 27.2 % — ABNORMAL LOW (ref 36.0–46.0)
HEMOGLOBIN: 9 g/dL — AB (ref 12.0–15.0)

## 2018-08-05 MED ORDER — OXYCODONE HCL 5 MG PO TABS: 5.0000 mg | ORAL_TABLET | Freq: Four times a day (QID) | ORAL | 0 refills | Status: DC | PRN

## 2018-08-05 NOTE — Discharge Summary (Signed)
OB Discharge Summary     Patient Name: Anna Choi DOB: Dec 11, 1995 MRN: [id number]  Date of admission: 08/01/2018 Delivering MD: [place] Bing   Date of discharge: 08/05/2018  Admitting diagnosis: INDUCTION Intrauterine pregnancy: [redacted]w[redacted]d     Secondary diagnosis:  Active Problems:   Post-dates pregnancy  Additional problems: none     Discharge diagnosis: Term Pregnancy Delivered                                                                                                Post partum procedures:none  Augmentation: C/S  Complications: None  Hospital course:  Induction of Labor With Cesarean Section  22 y.o. yo G1P1001 at [redacted]w[redacted]d was admitted to the hospital 08/01/2018 for induction of labor. Patient had a labor course significant for . The patient went for cesarean section due to Non-Reassuring FHR, and delivered a Viable infant,08/02/2018  Membrane Rupture Time/Date: 4:48 AM ,08/02/2018   Details of operation can be found in separate operative Note.  Patient had an uncomplicated postpartum course. She is ambulating, tolerating a regular diet, passing flatus, and urinating well.  Patient is discharged home in stable condition on 08/05/18.                                    Physical exam  Vitals:   08/04/18 0546 08/04/18 1452 08/04/18 2217 08/05/18 0600  BP: 104/60 (!) 112/53 (!) 109/57 108/64  Pulse: 81 92 81 80  Resp: 16 16 20 18   Temp: 98.1 F (36.7 C) 98 F (36.7 C) 97.9 F (36.6 C) 97.8 F (36.6 C)  TempSrc: Oral Oral Oral Oral  SpO2: 100% 98%  100%  Weight:      Height:       General: alert, cooperative and no distress Lochia: appropriate Uterine Fundus: firm Incision: Healing well with no significant drainage DVT Evaluation: No evidence of DVT seen on physical exam. Labs: Lab Results  Component Value Date   WBC 15.7 (H) 08/03/2018   HGB 9.0 (L) 08/05/2018   HCT 27.2 (L) 08/05/2018   MCV 90.2 08/03/2018   PLT 189 08/03/2018   CMP Latest Ref Rng &  Units 08/03/2018  Creatinine 0.44 - 1.00 mg/dL 4.09    Discharge instruction: per After Visit Summary and "Baby and Me Booklet".  After visit meds:  Allergies as of 08/05/2018   No Known Allergies     Medication List    TAKE these medications   albuterol 108 (90 Base) MCG/ACT inhaler Commonly known as:  PROVENTIL HFA;VENTOLIN HFA Inhale 2 puffs into the lungs every 4 (four) hours as needed for wheezing or shortness of breath.   oxyCODONE 5 MG immediate release tablet Commonly known as:  Oxy IR/ROXICODONE Take 1 tablet (5 mg total) by mouth every 6 (six) hours as needed (pain scale 4-7).   pantoprazole 20 MG tablet Commonly known as:  PROTONIX Take 1 tablet (20 mg total) by mouth daily.   PREPLUS 27-1 MG Tabs TK 1 T PO  QD AT 12 NOON  Diet: routine diet  Activity: Advance as tolerated. Pelvic rest for 6 weeks.   Outpatient follow up:1 week incision check Follow up Appt: Future Appointments  Date Time Provider Department Center  09/08/2018  2:15 PM Rasch, Harolyn Rutherford, NP [facility]   Follow up Visit:No follow-ups on file.  Postpartum contraception: Condoms  Newborn Data: Live born female  Birth Weight: 7 lb 12.3 oz (3525 g) APGAR: 9, 10  Newborn Delivery   Birth date/time:  08/02/2018 12:52:00 Delivery type:  C-Section, Low Transverse Trial of labor:  Yes C-section categorization:  Primary     Baby Feeding: Breast Disposition:home with mother   08/05/2018 Mirian Mo, MD

## 2018-08-05 NOTE — Discharge Instructions (Signed)

## 2018-08-05 NOTE — Telephone Encounter (Signed)
Patient aware of RN visit on 08/20/18 at 9:15am.

## 2018-08-05 NOTE — Lactation Note (Signed)
This note was copied from a baby's chart. Lactation Consultation Note  Patient Name: Anna Choi WKGSU'P Date: 08/05/2018 Reason for consult: Follow-up assessment Mom reports that breastfeeding is going well and she no longer needs the nipple shield.  Discussed milk coming to volume and the prevention and treatment of engorgement.  Mom has a manual pump for prn use.  Baby is at a 8% weight loss, 3 voids and 2 stools/24 hours.  Mom denies questions or concerns.  Lactation services and support reviewed and encouraged prn.  Maternal Data    Feeding Feeding Type: Breast Fed Length of feed: 31 min  LATCH Score                   Interventions    Lactation Tools Discussed/Used     Consult Status Consult Status: Complete Follow-up type: Call as needed    Huston Foley 08/05/2018, 8:49 AM

## 2018-08-20 ENCOUNTER — Ambulatory Visit: Payer: Medicaid Other

## 2018-09-08 ENCOUNTER — Encounter: Payer: Self-pay | Admitting: Obstetrics and Gynecology

## 2018-09-08 ENCOUNTER — Ambulatory Visit (INDEPENDENT_AMBULATORY_CARE_PROVIDER_SITE_OTHER): Payer: Medicaid Other | Admitting: Obstetrics and Gynecology

## 2018-09-08 DIAGNOSIS — Z1389 Encounter for screening for other disorder: Secondary | ICD-10-CM

## 2018-09-08 NOTE — Progress Notes (Signed)
Subjective:     Anna Choi is a 22 y.o. female who presents for a postpartum visit. She is 6 weeks postpartum following a low cervical transverse Cesarean section. I have fully reviewed the prenatal and intrapartum course. The delivery was at 41.1 gestational weeks. Outcome: primary cesarean section, low transverse incision. Anesthesia: spinal. Postpartum course has been unremarkable. Baby's course has been unremarkable. Baby is feeding by breast. Bleeding no bleeding. Bowel function is normal. Bladder function is normal. Patient is not sexually active. Contraception method is none. Postpartum depression screening: negative.  The following portions of the patient's history were reviewed and updated as appropriate: allergies, current medications, past family history, past medical history, past social history, past surgical history and problem list.  Review of Systems Pertinent items are noted in HPI.   Objective:    BP (!) 119/57   Pulse 79   Wt 199 lb 1.6 oz (90.3 kg)   LMP 10/18/2017 (Exact Date)   BMI 35.27 kg/m   General:  alert, cooperative and appears stated age   Breasts:  inspection negative, no nipple discharge or bleeding, no masses or nodularity palpable  Lungs: clear to auscultation bilaterally  Heart:  regular rate and rhythm, S1, S2 normal, no murmur, click, rub or gallop  Abdomen: soft, non-tender; bowel sounds normal; no masses,  no organomegaly incision clean, dry, intact.    Assessment:   Normal postpartum exam. Pap smear not done at today's visit. Last pap was 12/2017; abnormal, repeat pap in one year, around 12/2018  Plan:   1. Contraception: none 2. Incision check today. 3. Follow up in: Feb 2020 for repeat pap   Rasch, Harolyn Rutherford, NP 09/08/2018 2:36 PM

## 2018-09-23 ENCOUNTER — Inpatient Hospital Stay (HOSPITAL_COMMUNITY)
Admission: AD | Admit: 2018-09-23 | Discharge: 2018-09-23 | Disposition: A | Discharge status: Home / Self Care | Payer: Medicaid Other | Source: Ambulatory Visit | Attending: Obstetrics and Gynecology | Admitting: Obstetrics and Gynecology

## 2018-09-23 DIAGNOSIS — T8189XA Other complications of procedures, not elsewhere classified, initial encounter: Secondary | ICD-10-CM | POA: Diagnosis not present

## 2018-09-23 DIAGNOSIS — Y838 Other surgical procedures as the cause of abnormal reaction of the patient, or of later complication, without mention of misadventure at the time of the procedure: Secondary | ICD-10-CM | POA: Insufficient documentation

## 2018-09-23 NOTE — MAU Note (Addendum)
Pt had c/s 08/02/2018. Pt states she thinks she popped a stitch 3 days ago. States it started hurting 3 days ago. Pt reports some clear drainage. Denies odor.

## 2018-09-23 NOTE — MAU Provider Note (Signed)
Chief Complaint:  Postpartum Complications and Wound Check   First Provider Initiated Contact with Patient 09/23/18 2229      HPI: Anna Choi is a 21 y.o. G1P1001 who presents to maternity admissions reporting having pain at her C/S incision, thinks she broke a suture there.  Has some clear drainage. She reports no vaginal bleeding, vaginal itching/burning, urinary symptoms, h/a, dizziness, n/v, or fever/chills.    C/S was done on 08/02/18 (7 weeks ago)  No prior problems with incision.  Wound Check  She was originally treated 3 to 5 days ago. Her temperature was unmeasured prior to arrival. There has been clear discharge from the wound. There is no redness present. There is no swelling present. There is new pain present. She has no difficulty moving the affected extremity or digit.   RN Note: Pt had c/s 08/02/2018. Pt states she thinks she popped a stitch 3 days ago. States it started hurting 3 days ago. Pt reports some clear drainage. Denies odor.   Past Medical History: Past Medical History:  Diagnosis Date  . Asthma     Past obstetric history: OB History  Gravida Para Term Preterm AB Living  1 1 1     1   SAB TAB Ectopic Multiple Live Births        0 1    # Outcome Date GA Lbr Len/2nd Weight Sex Delivery Anes PTL Lv  1 Term 08/02/18 [redacted]w[redacted]d  3525 g M CS-LTranv EPI  LIV    Past Surgical History: Past Surgical History:  Procedure Laterality Date  . CESAREAN SECTION N/A 08/02/2018   Procedure: CESAREAN SECTION;  Surgeon: Raritan Bing, MD;  Location: [facility] BIRTHING SUITES;  Service: Obstetrics;  Laterality: N/A;  . TONSILLECTOMY    . TYMPANOSTOMY TUBE PLACEMENT      Family History: No family history on file.  Social History: Social History   Tobacco Use  . Smoking status: Never Smoker  . Smokeless tobacco: Never Used  Substance Use Topics  . Alcohol use: No    Frequency: Never  . Drug use: No    Allergies: No Known Allergies  Meds:  Medications Prior to  Admission  Medication Sig Dispense Refill Last Dose  . albuterol (PROVENTIL HFA;VENTOLIN HFA) 108 (90 Base) MCG/ACT inhaler Inhale 2 puffs into the lungs every 4 (four) hours as needed for wheezing or shortness of breath. (Patient not taking: Reported on 08/01/2018) 1 Inhaler 3 Not Taking  . oxyCODONE (OXY IR/ROXICODONE) 5 MG immediate release tablet Take 1 tablet (5 mg total) by mouth every 6 (six) hours as needed (pain scale 4-7). (Patient not taking: Reported on 09/08/2018) 12 tablet 0 Not Taking  . pantoprazole (PROTONIX) 20 MG tablet Take 1 tablet (20 mg total) by mouth daily. (Patient not taking: Reported on 07/15/2018) 30 tablet 1 Not Taking  . Prenatal Vit-Fe Fumarate-FA (PREPLUS) 27-1 MG TABS TK 1 T PO  QD AT 12 NOON  12 Not Taking    I have reviewed patient's Past Medical Hx, Surgical Hx, Family Hx, Social Hx, medications and allergies.  ROS:  Review of Systems  Constitutional: Negative for chills, fatigue and fever.  Respiratory: Negative for shortness of breath.   Genitourinary: Negative for dysuria, pelvic pain, vaginal bleeding and vaginal discharge.  Musculoskeletal: Negative for back pain.   Other systems negative     Physical Exam   Patient Vitals for the past 24 hrs:  BP Temp Temp src Pulse Resp SpO2 Height Weight  09/23/18 2223 116/71 98.4 F (  36.9 C) Oral 78 16 100 % 5' 3.5" (1.613 m) 91.6 kg   Constitutional: Well-developed, well-nourished female in no acute distress.  Cardiovascular: normal rate and rhythm Respiratory: normal effort, no distress GI: Abd soft, non-tender.  Nondistended.  No rebound, No guarding.  Incision:  There is a small area of erethema and swelling in center of well-healed scar.  When pressure applied, a small amount of serous liquid is expressed.  There is no opening except a pinpoint are which exudes serous liquid.  No dehiscence       MS: Extremities nontender, no edema, normal ROM Neurologic: Alert and oriented x 4.   Grossly  nonfocal. GU: Neg CVAT. Skin:  Warm and Dry Psych:  Affect appropriate.  PELVIC EXAM: deferred    Labs: --/--/A POS, A POS Performed at [facility], [address], [place], [place] [address]  (09/07 0900) No results found for this or any previous visit (from the past 24 hour(s)).  Imaging:  No results found.  MAU Course/MDM: I have ordered labs as follows: none Imaging ordered: none .   Treatments in MAU included dry dressing applied. Suspect this are has had some pressure on it and swelled up .Marland Kitchen Then serous liquid accumulated.  .   Pt stable at time of discharge.  Assessment: 7 weeks postop C/S Granuloma at wound site No infection or dehiscence  Plan: Discharge home Recommend keeping wound clean and dry.  If it gets worse, more red or drains more, come to walkin clinic downstairs   Discussed it should heal up on its own  Encouraged to return here or to other Urgent Care/ED if she develops worsening of symptoms, increase in pain, fever, or other concerning symptoms.   Wynelle Bourgeois CNM, MSN Certified Nurse-Midwife 09/23/2018 10:29 PM

## 2018-09-23 NOTE — Discharge Instructions (Signed)

## 2019-05-24 ENCOUNTER — Emergency Department (HOSPITAL_COMMUNITY)
Admission: EM | Admit: 2019-05-24 | Discharge: 2019-05-24 | Disposition: A | Discharge status: Home / Self Care | Payer: Medicaid Other | Attending: Emergency Medicine | Admitting: Emergency Medicine

## 2019-05-24 ENCOUNTER — Encounter (HOSPITAL_COMMUNITY): Payer: Self-pay

## 2019-05-24 ENCOUNTER — Other Ambulatory Visit: Payer: Self-pay

## 2019-05-24 DIAGNOSIS — J45909 Unspecified asthma, uncomplicated: Secondary | ICD-10-CM | POA: Insufficient documentation

## 2019-05-24 DIAGNOSIS — N751 Abscess of Bartholin's gland: Secondary | ICD-10-CM | POA: Diagnosis not present

## 2019-05-24 DIAGNOSIS — N7689 Other specified inflammation of vagina and vulva: Secondary | ICD-10-CM | POA: Diagnosis present

## 2019-05-24 LAB — COMPREHENSIVE METABOLIC PANEL
ALT: 19 U/L (ref 0–44)
AST: 15 U/L (ref 15–41)
Albumin: 3.7 g/dL (ref 3.5–5.0)
Alkaline Phosphatase: 83 U/L (ref 38–126)
Anion gap: 9 (ref 5–15)
BUN: 12 mg/dL (ref 6–20)
CO2: 23 mmol/L (ref 22–32)
Calcium: 9.3 mg/dL (ref 8.9–10.3)
Chloride: 106 mmol/L (ref 98–111)
Creatinine, Ser: 0.73 mg/dL (ref 0.44–1.00)
GFR calc Af Amer: >60 mL/min (ref >60)
GFR calc non Af Amer: >60 mL/min (ref >60)
Glucose, Bld: 94 mg/dL (ref 70–99)
Potassium: 3.7 mmol/L (ref 3.5–5.1)
Sodium: 138 mmol/L (ref 135–145)
Total Bilirubin: 0.5 mg/dL (ref 0.3–1.2)
Total Protein: 7 g/dL (ref 6.5–8.1)

## 2019-05-24 LAB — CBC WITH DIFFERENTIAL/PLATELET
Abs Immature Granulocytes: 0.07 10*3/uL (ref 0.00–0.07)
Basophils Absolute: 0 10*3/uL (ref 0.0–0.1)
Basophils Relative: 0 %
Eosinophils Absolute: 0.1 10*3/uL (ref 0.0–0.5)
Eosinophils Relative: 1 %
HCT: 39.2 % (ref 36.0–46.0)
Hemoglobin: 12.8 g/dL (ref 12.0–15.0)
Immature Granulocytes: 0 %
Lymphocytes Relative: 12 %
Lymphs Abs: 1.9 10*3/uL (ref 0.7–4.0)
MCH: 29.4 pg (ref 26.0–34.0)
MCHC: 32.7 g/dL (ref 30.0–36.0)
MCV: 90.1 fL (ref 80.0–100.0)
Monocytes Absolute: 1.4 10*3/uL — ABNORMAL HIGH (ref 0.1–1.0)
Monocytes Relative: 9 %
Neutro Abs: 12.3 10*3/uL — ABNORMAL HIGH (ref 1.7–7.7)
Neutrophils Relative %: 78 %
Platelets: 292 10*3/uL (ref 150–400)
RBC: 4.35 MIL/uL (ref 3.87–5.11)
RDW: 12.2 % (ref 11.5–15.5)
WBC: 15.8 10*3/uL — ABNORMAL HIGH (ref 4.0–10.5)
nRBC: 0 % (ref 0.0–0.2)

## 2019-05-24 LAB — URINALYSIS, ROUTINE W REFLEX MICROSCOPIC
Bilirubin Urine: NEGATIVE
Glucose, UA: NEGATIVE mg/dL
Ketones, ur: NEGATIVE mg/dL
Leukocytes,Ua: NEGATIVE
Nitrite: NEGATIVE
Protein, ur: 30 mg/dL — AB
RBC / HPF: >50 RBC/hpf — ABNORMAL HIGH (ref 0–5)
Specific Gravity, Urine: 1.016 (ref 1.005–1.030)
pH: 8 (ref 5.0–8.0)

## 2019-05-24 LAB — WET PREP, GENITAL
Clue Cells Wet Prep HPF POC: NONE SEEN
Sperm: NONE SEEN
Trich, Wet Prep: NONE SEEN
Yeast Wet Prep HPF POC: NONE SEEN

## 2019-05-24 LAB — LACTIC ACID, PLASMA: Lactic Acid, Venous: 1.3 mmol/L (ref 0.5–1.9)

## 2019-05-24 MED ORDER — SODIUM CHLORIDE 0.9 % IV BOLUS (SEPSIS)
1000.0000 mL | Freq: Once | INTRAVENOUS | Status: AC
  Administered 2019-05-24: 1000 mL via INTRAVENOUS

## 2019-05-24 MED ORDER — LIDOCAINE HCL (PF) 1 % IJ SOLN
5.0000 mL | Freq: Once | INTRAMUSCULAR | Status: AC
  Administered 2019-05-24: 10:00:00 5 mL
  Filled 2019-05-24: qty 5

## 2019-05-24 MED ORDER — SODIUM CHLORIDE 0.9 % IV SOLN
2.0000 g | INTRAVENOUS | Status: DC
  Administered 2019-05-24: 2 g via INTRAVENOUS
  Filled 2019-05-24: qty 20

## 2019-05-24 MED ORDER — ACETAMINOPHEN 325 MG PO TABS
650.0000 mg | ORAL_TABLET | Freq: Once | ORAL | Status: AC
  Administered 2019-05-24: 10:00:00 650 mg via ORAL
  Filled 2019-05-24: qty 2

## 2019-05-24 MED ORDER — CEPHALEXIN 500 MG PO CAPS: 500.0000 mg | ORAL_CAPSULE | Freq: Four times a day (QID) | ORAL | 0 refills | Status: DC

## 2019-05-24 MED ORDER — SODIUM CHLORIDE 0.9 % IV BOLUS
1000.0000 mL | Freq: Once | INTRAVENOUS | Status: AC
  Administered 2019-05-24: 12:00:00 1000 mL via INTRAVENOUS

## 2019-05-24 NOTE — ED Notes (Signed)
Provider aware 500cc ns given from last fluids. States to continue dc.

## 2019-05-24 NOTE — Discharge Instructions (Addendum)
You were seen in the ED today for a bartholin's abscess; we were unable to drain much pus from the area today. Please take antibiotics as prescribed and follow up with [facility]; they take walk in patients on specific days; please call to find out when they have walk in availability.

## 2019-05-24 NOTE — ED Notes (Signed)
DC instructions discussed and pt performs teachback and verbalizes understanding.

## 2019-05-24 NOTE — ED Provider Notes (Addendum)
[facility] Provider Note   CSN: [id number] Arrival date & time: 05/24/19  0856    History   Chief Complaint Chief Complaint  Patient presents with   Abscess    HPI Anna Choi is a 23 y.o. female with PMHx asthma who presents to the ED today complaining of an abscess to her left labia that began 2 days ago. Pt endorses pain to the area with touch as well as growing in size since onset. She has been taking Midol as pt is currently on her period and thought she was just having pain from her cramping. She has never had an abscess in this area before. Denies drainage. Pt states she felt warm this morning, prompting her to come to the ED. Found to be febrile in the ED today. Denies chills, abdominal pain, nausea, vomiting, urinary frequency, dysuria, pelvic pain, vaginal discharge, or any other associated symptoms.        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   Asthma 02/11/2018   Low grade squamous intraepith lesion on cytologic smear cervix (lgsil) 02/11/2018   Supervision of normal first pregnancy, antepartum 01/21/2018    Past Surgical History:  Procedure Laterality Date   CESAREAN SECTION N/A 08/02/2018   Procedure: CESAREAN SECTION;  Surgeon: Tamiami BingPickens, Charlie, MD;  [facility];  Service: Obstetrics;  Laterality: N/A;   TONSILLECTOMY     TYMPANOSTOMY TUBE PLACEMENT       OB History    Gravida  1   Para  1   Term  1   Preterm      AB      Living  1     SAB      TAB      Ectopic      Multiple  0   Live Births  1            Home Medications    Prior to Admission medications   Medication Sig Start Date End Date Taking? Authorizing Provider  cephALEXin (KEFLEX) 500 MG capsule Take 1 capsule  (500 mg total) by mouth 4 (four) times daily. 05/24/19   Tanda RockersVenter, Summerlynn Glauser, PA-C    Family History No family history on file.  Social History Social History   Tobacco Use   Smoking status: Never Smoker   Smokeless tobacco: Never Used  Substance Use Topics   Alcohol use: No    Frequency: Never   Drug use: No     Allergies   Patient has no known allergies.   Review of Systems Review of Systems  Constitutional: Positive for fever. Negative for chills.  HENT: Negative for congestion.   Eyes: Negative for visual disturbance.  Respiratory: Negative for cough and shortness of breath.   Cardiovascular: Negative for chest pain.  Gastrointestinal: Negative for abdominal pain, constipation, diarrhea, nausea and vomiting.  Genitourinary: Positive for vaginal bleeding (on period). Negative for dysuria, flank pain, frequency, hematuria, pelvic pain, urgency, vaginal discharge and vaginal pain.  Musculoskeletal: Negative for myalgias.  Skin:       + abscess  Neurological: Negative for dizziness.     Physical Exam Updated Vital Signs BP 119/78 (BP Location: Right Arm)    Pulse (!) 110    Temp (!) 101.1 F (38.4 C) (Oral)  Resp 16    Ht 5' 3.5" (1.613 m)    Wt 86.2 kg    SpO2 100%    BMI 33.13 kg/m   Physical Exam Vitals signs and nursing note reviewed.  Constitutional:      Appearance: She is not ill-appearing.  HENT:     Head: Normocephalic and atraumatic.     Right Ear: Tympanic membrane normal.     Left Ear: Tympanic membrane normal.     Nose: Nose normal.     Mouth/Throat:     Mouth: Mucous membranes are moist.     Pharynx: No oropharyngeal exudate or posterior oropharyngeal erythema.  Eyes:     General: No scleral icterus.    Conjunctiva/sclera: Conjunctivae normal.  Neck:     Musculoskeletal: Normal range of motion and neck supple.  Cardiovascular:     Rate and Rhythm: Regular rhythm. Tachycardia present.  Pulmonary:     Effort: Pulmonary effort is normal.      Breath sounds: Normal breath sounds. No wheezing, rhonchi or rales.  Abdominal:     Palpations: Abdomen is soft.     Tenderness: There is no abdominal tenderness. There is no guarding or rebound.  Genitourinary:    Comments: Chaperone present for exam. Bartholin's abscess to left labia minora extending into the labia majora with induration; small amount of fluctuance appreciated; no erythema. No vaginal discharge or bleeding within vaginal vault. No adnexal masses, tenderness, or fullness. No CMT, cervical friability, or discharge from cervical os. Cervical os is closed.   Lymphadenopathy:     Cervical: No cervical adenopathy.  Skin:    General: Skin is warm and dry.  Neurological:     Mental Status: She is alert.      ED Treatments / Results  Labs (all labs ordered are listed, but only abnormal results are displayed) Labs Reviewed  WET PREP, GENITAL - Abnormal; Notable for the following components:      Result Value   WBC, Wet Prep HPF POC MODERATE (*)    All other components within normal limits  CBC WITH DIFFERENTIAL/PLATELET - Abnormal; Notable for the following components:   WBC 15.8 (*)    Neutro Abs 12.3 (*)    Monocytes Absolute 1.4 (*)    All other components within normal limits  URINALYSIS, ROUTINE W REFLEX MICROSCOPIC - Abnormal; Notable for the following components:   APPearance CLOUDY (*)    Hgb urine dipstick LARGE (*)    Protein, ur 30 (*)    RBC / HPF >50 (*)    Bacteria, UA RARE (*)    All other components within normal limits  CULTURE, BLOOD (ROUTINE X 2)  CULTURE, BLOOD (ROUTINE X 2)  LACTIC ACID, PLASMA  COMPREHENSIVE METABOLIC PANEL  RPR  HIV ANTIBODY (ROUTINE TESTING W REFLEX)  GC/CHLAMYDIA PROBE AMP ([place]) [facility]    EKG None  Radiology No results found.  Procedures .Marland Kitchen.Incision and Drainage  Date/Time: 05/25/2019 7:23 AM Performed by: Tanda RockersVenter, Asja Frommer, PA-C Authorized by: Tanda RockersVenter, Kenric Ginger, PA-C   Consent:    Consent  obtained:  Verbal   Consent given by:  Patient   Risks discussed:  Bleeding, incomplete drainage and pain Location:    Type:  Bartholin cyst Pre-procedure details:    Skin preparation:  Betadine Anesthesia (see MAR for exact dosages):    Anesthesia method:  Local infiltration   Local anesthetic:  Lidocaine 1% w/o epi Procedure type:    Complexity:  Simple Procedure details:  Incision types:  Stab incision   Scalpel blade:  11   Drainage:  Bloody   Drainage amount:  Scant   Packing materials:  Word catheter Post-procedure details:    Patient tolerance of procedure:  Tolerated well, no immediate complications   (including critical care time)  Medications Ordered in ED Medications  sodium chloride 0.9 % bolus 1,000 mL (0 mLs Intravenous Stopped 05/24/19 1150)  acetaminophen (TYLENOL) tablet 650 mg (650 mg Oral Given 05/24/19 0956)  lidocaine (PF) (XYLOCAINE) 1 % injection 5 mL (5 mLs Infiltration Given 05/24/19 1025)  sodium chloride 0.9 % bolus 1,000 mL (0 mLs Intravenous Stopped 05/24/19 1338)     Initial Impression / Assessment and Plan / ED Course  I have reviewed the triage vital signs and the nursing notes.  Pertinent labs & imaging results that were available during my care of the patient were reviewed by me and considered in my medical decision making (see chart for details).    24 year old otherwise healthy female presenting today with bartholin's abscess x 2 days; no drainage. She endorses feeling "warm" at home; found to be febrile in the ED at 101.1 F; pt also tachycardic at 110; currently meets sepsis criteria with suspected source of infection being the abscess. Will treat with 2 g Ceftriaxone IV in the ED. Blood cultures, lactic acid, and baseline bloodwork obtained. Will perform pelvic exam as well to better visualize area; likely I&D today with word catheter placement. Fluids given as well; will reevaluate vitals afterwards. Questionable admission vs discharge home  given source of infection is an abscess; no other complaints including cough, shortness of breath, urinary symptoms, headache to warrant further testing.   I&D with minimal amount of purulent drainage; word catheter inserted to allow for further drainage. Given small amount of drainage from area will send home with antibiotics if pt deemed appropriate for discharge. She has already received 1 L fl bolus but continues to be mildly tachycardic; will give another liter and reassess. Pt does have leukocytosis of 15.8 with normal lactic acid. Fever reduced with Tylenol in the ED. All other labwork reassuring including no electrolyte derangements and no obvious findings on wet prep. Still awaiting U/A. If pt continues to be tachycardic will have to consider admission; she does endorse anxiety with medical providers.   After another liter of fluid tachycardia has improved; HR into the 80's now. Upon reevaluation pt's heart rate increases to low 100's; believe she is anxious from ED visit. Will discharge patient at this time with close OBGYN follow up; resource given for [facility] with walkin availability. Pt in agreement with plan at this time. Stable for discharge home. Oral abx given as well.         Final Clinical Impressions(s) / ED Diagnoses   Final diagnoses:  Bartholin's gland abscess    ED Discharge Orders         Ordered    cephALEXin (KEFLEX) 500 MG capsule  4 times daily     05/24/19 1307           Eustaquio Maize, PA-C 05/24/19 1807    Eustaquio Maize, PA-C 05/25/19 0725    Charlesetta Shanks, MD 05/26/19 [phone]

## 2019-05-24 NOTE — ED Triage Notes (Signed)
Pt c/o swelling at left labia x 2 days.

## 2019-05-25 LAB — GC/CHLAMYDIA PROBE AMP (~~LOC~~) NOT AT ARMC
Chlamydia: NEGATIVE
Neisseria Gonorrhea: NEGATIVE

## 2019-05-25 LAB — RPR: RPR Ser Ql: NONREACTIVE

## 2019-05-25 LAB — HIV ANTIBODY (ROUTINE TESTING W REFLEX): HIV Screen 4th Generation wRfx: NONREACTIVE

## 2019-05-26 ENCOUNTER — Encounter (HOSPITAL_COMMUNITY): Payer: Self-pay | Admitting: Emergency Medicine

## 2019-05-26 ENCOUNTER — Other Ambulatory Visit: Payer: Self-pay

## 2019-05-26 ENCOUNTER — Emergency Department (HOSPITAL_COMMUNITY)
Admission: EM | Admit: 2019-05-26 | Discharge: 2019-05-26 | Disposition: A | Discharge status: Home / Self Care | Payer: Medicaid Other | Attending: Emergency Medicine | Admitting: Emergency Medicine

## 2019-05-26 DIAGNOSIS — N751 Abscess of Bartholin's gland: Secondary | ICD-10-CM | POA: Diagnosis not present

## 2019-05-26 DIAGNOSIS — R102 Pelvic and perineal pain: Secondary | ICD-10-CM | POA: Diagnosis present

## 2019-05-26 MED ORDER — HYDROCODONE-ACETAMINOPHEN 5-325 MG PO TABS
2.0000 | ORAL_TABLET | Freq: Once | ORAL | Status: AC
  Administered 2019-05-26: 2 via ORAL
  Filled 2019-05-26: qty 2

## 2019-05-26 MED ORDER — LIDOCAINE HCL 2 % IJ SOLN
10.0000 mL | Freq: Once | INTRAMUSCULAR | Status: AC
  Administered 2019-05-26: 200 mg
  Filled 2019-05-26: qty 20

## 2019-05-26 NOTE — ED Triage Notes (Signed)
Pt reports having an abscess lanced 2 days ago and reports pain and swelling has not improved.

## 2019-05-26 NOTE — ED Provider Notes (Signed)
[facility] Provider Note   CSN: [id number] Arrival date & time: 05/26/19  1005     History   Chief Complaint Chief Complaint  Patient presents with  . Abscess    HPI Anna Choi is a 23 y.o. female.     The history is provided by the patient. No language interpreter was used.  Abscess Location:  Pelvis Pelvic abscess location:  Vulva Size:  8 Abscess quality: redness and warmth   Red streaking: no   Progression:  Worsening Chronicity:  New Context: not diabetes   Relieved by:  Nothing Worsened by:  Nothing Ineffective treatments:  None tried Associated symptoms: no fever   Pt had I and D here 2 days ago.  Pt reports word cath fell out 3 hours after procedure.    Past Medical History:  Diagnosis Date  . Asthma     Patient Active Problem List   Diagnosis Date Noted  . Post-dates pregnancy 08/01/2018  . GERD (gastroesophageal reflux disease) 05/07/2018  . 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 Asthma 02/11/2018  . Low grade squamous intraepith lesion on cytologic smear cervix (lgsil) 02/11/2018  . Supervision of normal first pregnancy, antepartum 01/21/2018    Past Surgical History:  Procedure Laterality Date  . CESAREAN SECTION N/A 08/02/2018   Procedure: CESAREAN SECTION;  Surgeon: Avalon BingPickens, Charlie, MD;  [facility];  Service: Obstetrics;  Laterality: N/A;  . TONSILLECTOMY    . TYMPANOSTOMY TUBE PLACEMENT       OB History    Gravida  1   Para  1   Term  1   Preterm      AB      Living  1     SAB      TAB      Ectopic      Multiple  0   Live Births  1            Home Medications    Prior to Admission medications   Medication Sig Start Date End Date Taking? Authorizing Provider  cephALEXin (KEFLEX) 500 MG capsule Take 1 capsule (500 mg total) by mouth 4 (four) times daily. 05/24/19   Tanda RockersVenter, Margaux, PA-C    Family History No family history on file.  Social History  Social History   Tobacco Use  . Smoking status: Never Smoker  . Smokeless tobacco: Never Used  Substance Use Topics  . Alcohol use: No    Frequency: Never  . Drug use: No     Allergies   Patient has no known allergies.   Review of Systems Review of Systems  Constitutional: Negative for fever.  Genitourinary: Positive for vaginal pain.  All other systems reviewed and are negative.    Physical Exam Updated Vital Signs BP 120/60 (BP Location: Right Arm)   Pulse 90   Temp 99 F (37.2 C) (Oral)   Resp 16   Ht 5\' 3"  (1.6 m)   Wt 86.2 kg   LMP 05/24/2019   SpO2 99%   BMI 33.66 kg/m   Physical Exam Vitals signs and nursing note reviewed.  Constitutional:      Appearance: She is well-developed.  HENT:     Head: Normocephalic.  Neck:     Musculoskeletal: Normal range of motion.  Pulmonary:     Effort: Pulmonary effort is normal.  Abdominal:     General: There is no distension.  Genitourinary:    Comments: Left labia enlarged,  fluctuant Musculoskeletal:  Normal range of motion.  Neurological:     Mental Status: She is alert and oriented to person, place, and time.      ED Treatments / Results  Labs (all labs ordered are listed, but only abnormal results are displayed) Labs Reviewed - No data to display  EKG None  Radiology No results found.  Procedures .Marland KitchenIncision and Drainage  Date/Time: 05/26/2019 3:03 PM Performed by: Fransico Meadow, PA-C Authorized by: Fransico Meadow, PA-C   Consent:    Consent obtained:  Verbal   Consent given by:  Patient   Risks discussed:  Bleeding   Alternatives discussed:  Delayed treatment Location:    Type:  Bartholin cyst   Size:  8   Location:  Anogenital   Anogenital location:  Vulva Pre-procedure details:    Skin preparation:  Betadine Anesthesia (see MAR for exact dosages):    Anesthesia method:  Local infiltration   Local anesthetic:  Lidocaine 1% w/o epi Procedure type:    Complexity:  Simple  Procedure details:    Needle aspiration: no     Incision types:  Single straight   Incision depth:  Subcutaneous   Scalpel blade:  11   Wound management:  Probed and deloculated   Drainage:  Purulent   Wound treatment:  Wound left open   Packing materials:  1/2 in gauze Post-procedure details:    Patient tolerance of procedure:  Tolerated well, no immediate complications   (including critical care time)  Medications Ordered in ED Medications  HYDROcodone-acetaminophen (NORCO/VICODIN) 5-325 MG per tablet 2 tablet (2 tablets Oral Given 05/26/19 1115)  lidocaine (XYLOCAINE) 2 % (with pres) injection 200 mg (200 mg Infiltration Given by Other 05/26/19 1328)     Initial Impression / Assessment and Plan / ED Course  I have reviewed the triage vital signs and the nursing notes.  Pertinent labs & imaging results that were available during my care of the patient were reviewed by me and considered in my medical decision making (see chart for details).        MDM   Pt advised to continue current medications.  Remove packing in 2 days  Final Clinical Impressions(s) / ED Diagnoses   Final diagnoses:  Bartholin's gland abscess    ED Discharge Orders    None    An After Visit Summary was printed and given to the patient.    Fransico Meadow, PA-C 05/26/19 [place], [place], MD 05/27/19 [phone]

## 2019-05-26 NOTE — ED Notes (Signed)
Helped pt clean herself and given pad and mesh underwear- pt getting dressed.

## 2019-05-26 NOTE — Discharge Instructions (Signed)
Remove packing in 2 days.  Continue antibiotics

## 2019-05-29 LAB — CULTURE, BLOOD (ROUTINE X 2)
Culture: NO GROWTH
Culture: NO GROWTH
Special Requests: ADEQUATE
Special Requests: ADEQUATE

## 2019-08-12 IMAGING — US US MFM OB DETAIL+14 WK
1 series · 14 of 28 positions shown · non-contrast
Comparison: none

[Series 1: us mfm ob detail+14 wk · 58 acquisitions, 14 frames shown]
[im 3/58]
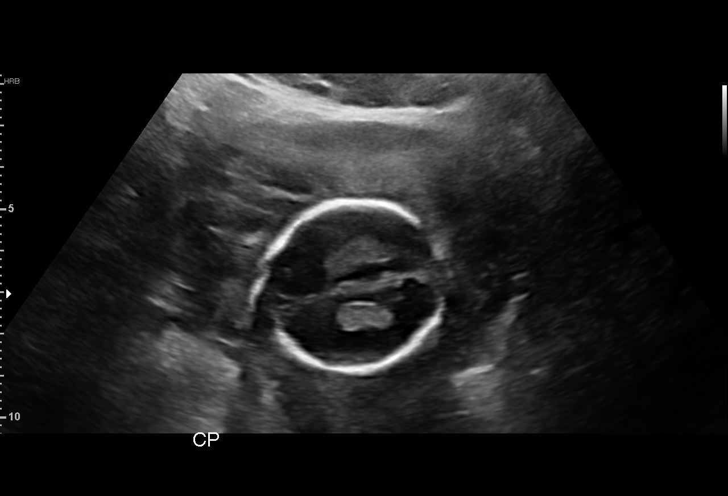
[im 7/58]
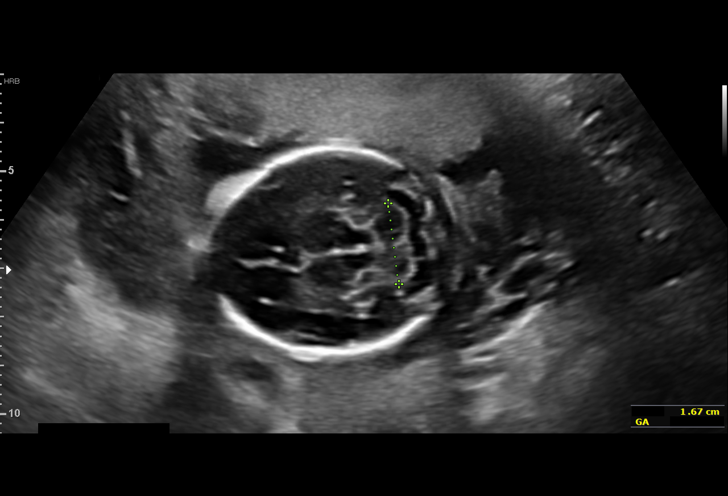
[im 11/58]
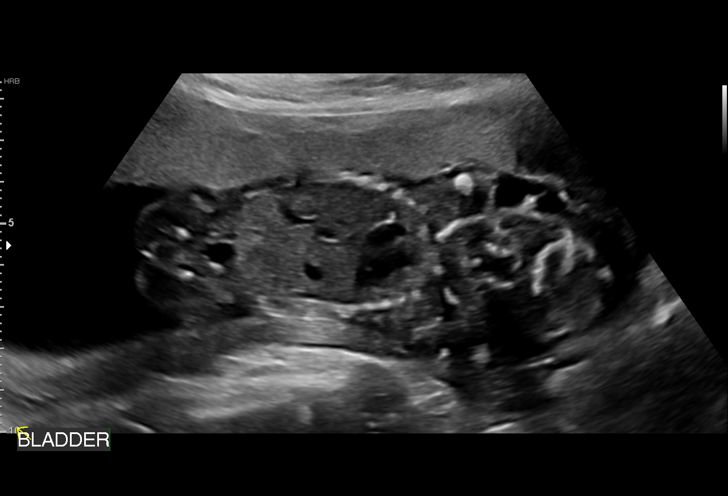
[im 15/58]
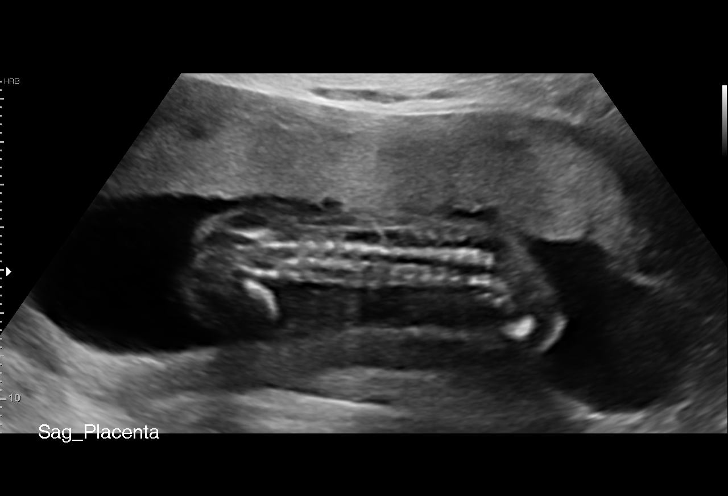
[im 20/58]
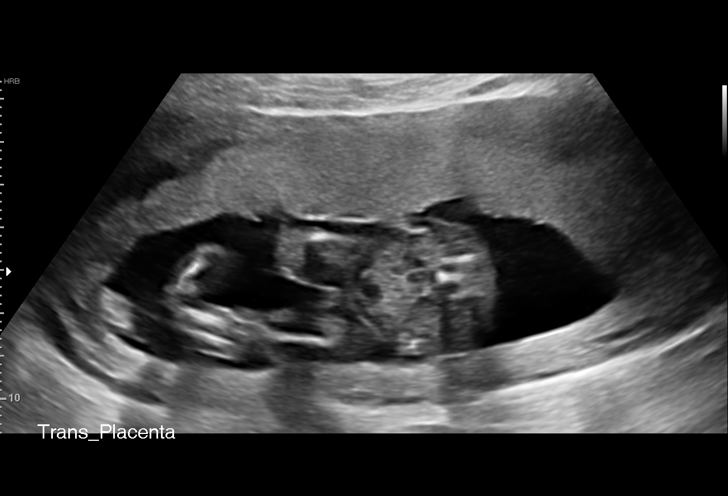
[im 24/58]
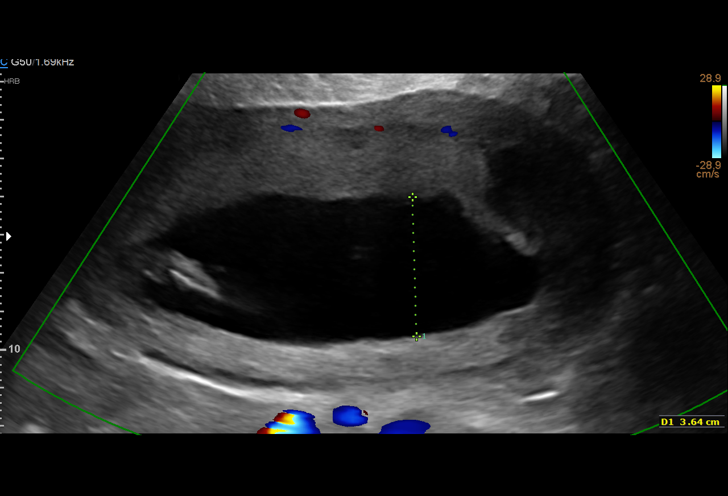
[im 28/58]
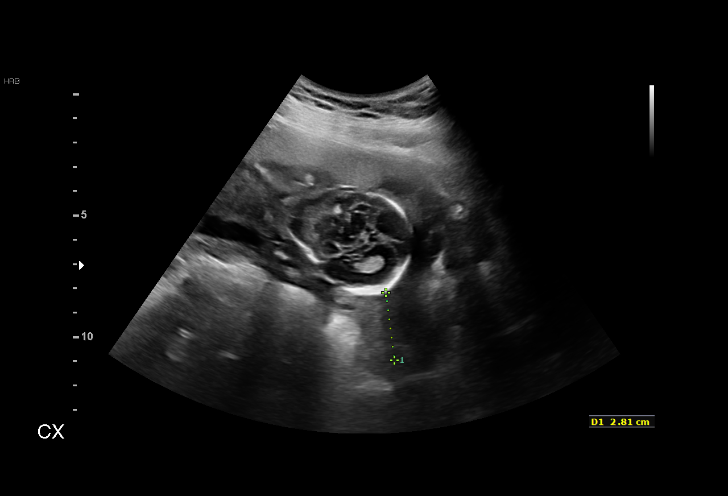
[im 32/58]
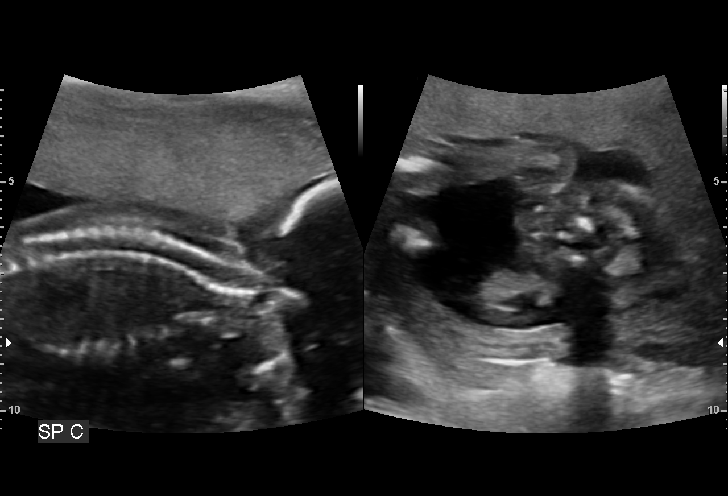
[im 36/58]
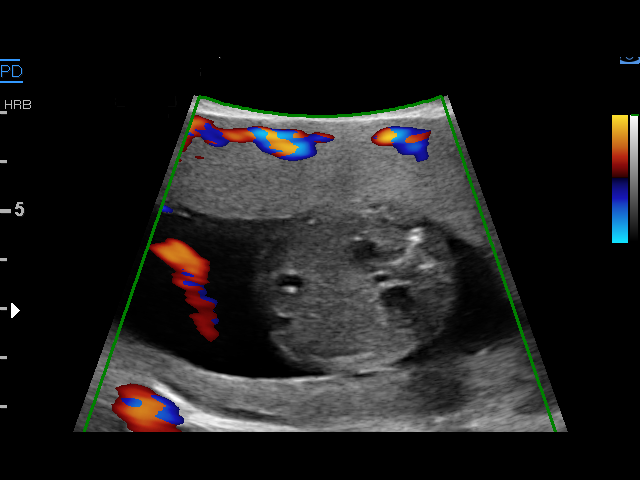
[im 41/58]
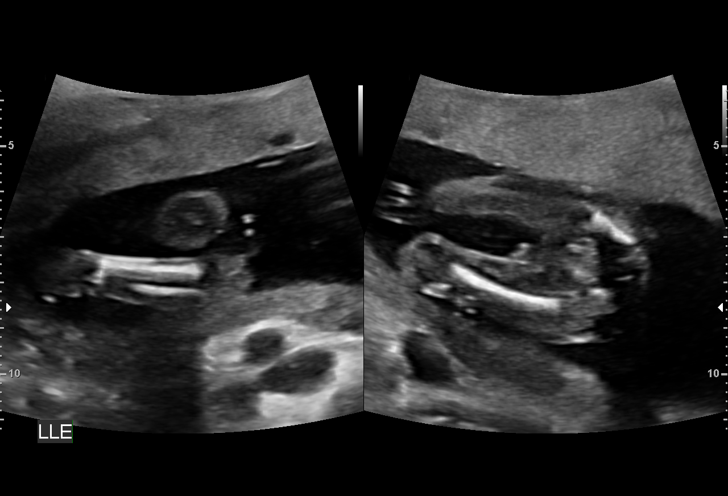
[im 45/58]
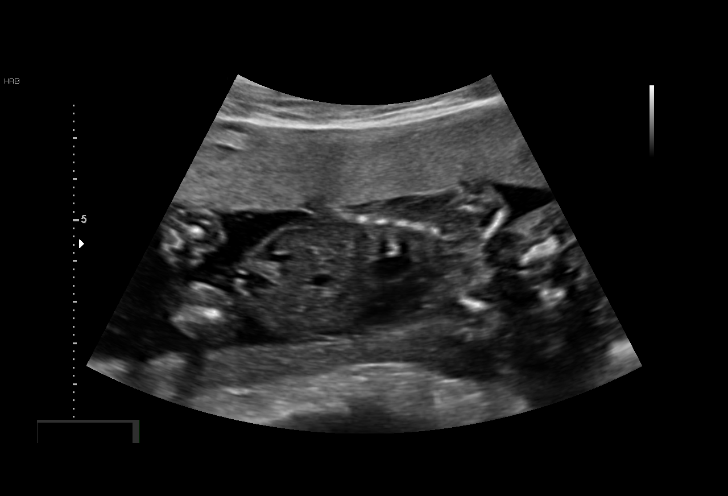
[im 49/58]
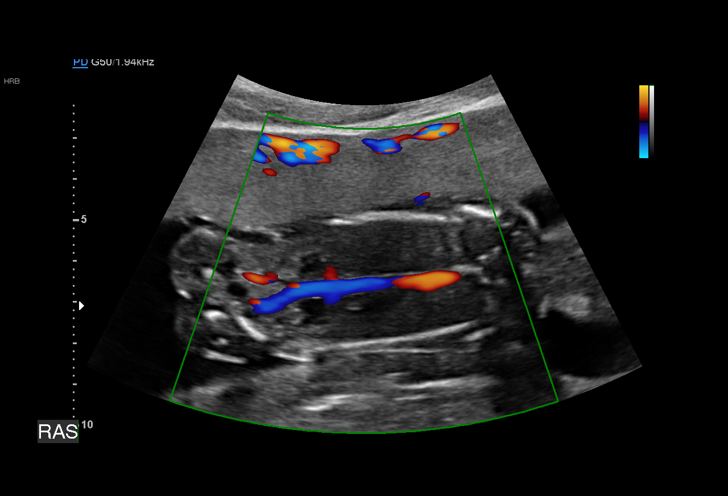
[im 53/58]
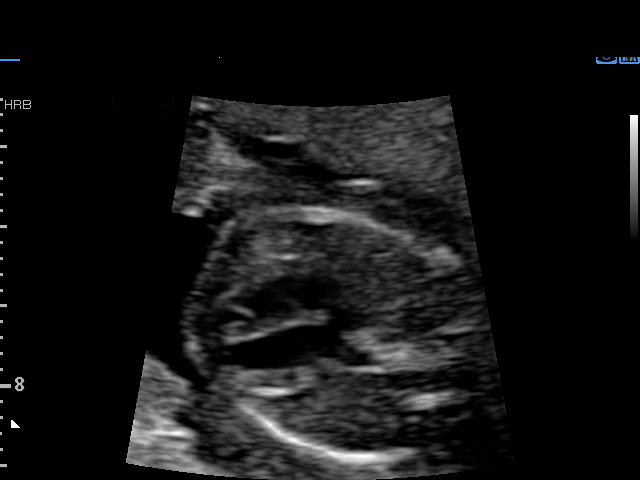
[im 58/58]
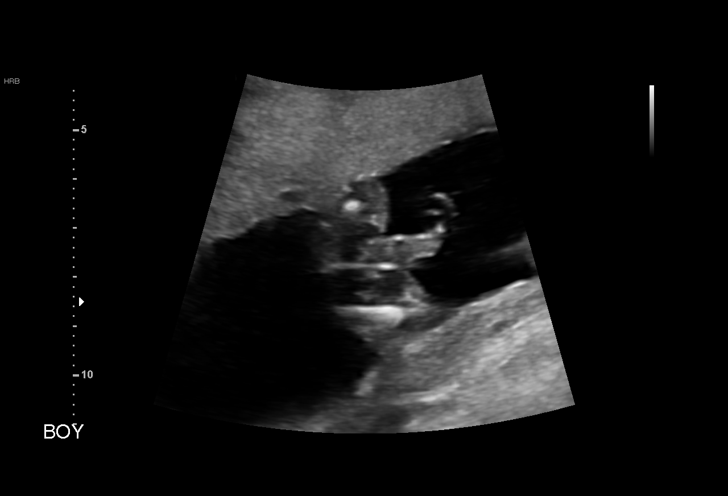

[14 of 28 positions shown; findings below may reference images not displayed]

OB/Gyn Clinic

1  JEAN-ALAIN RIQUELME           [id number]      [id number]     [id number]
Indications

18 weeks gestation of pregnancy
Encounter for antenatal screening for
malformations
Obesity complicating pregnancy, second
trimester (pregravid BMI 34)
OB History

Blood Type:            Height:  5'3"   Weight (lb):  194       BMI:
Gravidity:    1         Term:   0        Prem:   0        SAB:   0
TOP:          0       Ectopic:  0        Living: 0
Fetal Evaluation

Num Of Fetuses:     1
Fetal Heart         137
Rate(bpm):
Cardiac Activity:   Observed
Presentation:       Cephalic
Placenta:           Anterior, above cervical os
P. Cord Insertion:  Visualized, central

Amniotic Fluid
AFI FV:      Subjectively within normal limits

AFI Sum(cm)     %Tile       Largest Pocket(cm)
9.95            15
RUQ(cm)       RLQ(cm)       LUQ(cm)        LLQ(cm)
2.92
Biometry

BPD:      41.6  mm     G. Age:  18w 4d         39  %    CI:        78.61   %    70 - 86
FL/HC:      18.6   %    16.1 -
HC:      148.4  mm     G. Age:  18w 0d          9  %    HC/AC:      1.16        1.09 -
AC:      127.7  mm     G. Age:  18w 3d         30  %    FL/BPD:     66.3   %
FL:       27.6  mm     G. Age:  18w 3d         29  %    FL/AC:      21.6   %    20 - 24
HUM:        28  mm     G. Age:  19w 0d         55  %
CER:      16.7  mm     G. Age:  16w 5d        < 5  %
NFT:       5.5  mm

CM:        2.6  mm

Est. FW:     236  gm      0 lb 8 oz     35  %
Gestational Age

LMP:           18w 6d        Date:  10/18/17                 EDD:   07/25/18
U/S Today:     18w 3d                                        EDD:   07/28/18
Best:          18w 6d     Det. By:  LMP  (10/18/17)          EDD:   07/25/18
Anatomy

Cranium:               Appears normal         Aortic Arch:            Not well visualized
Cavum:                 Appears normal         Ductal Arch:            Not well visualized
Ventricles:            Appears normal         Diaphragm:              Appears normal
Choroid Plexus:        Appears normal         Stomach:                Appears normal, left
sided
Cerebellum:            Appears normal         Abdomen:                Appears normal
Posterior Fossa:       Appears normal         Abdominal Wall:         Appears nml (cord
insert, abd wall)
Nuchal Fold:           Appears normal         Cord Vessels:           Appears normal (3
vessel cord)
Face:                  Orbits nl; profile not Kidneys:                Appear normal
well visualized
Lips:                  Not well visualized    Bladder:                Appears normal
Thoracic:              Appears normal         Spine:                  Appears normal
Heart:                 Not well visualized    Upper Extremities:      Appears normal
RVOT:                  Appears normal         Lower Extremities:      Appears normal
LVOT:                  Not well visualized

Other:  Fetus appears to be a male. Technically difficult due to maternal
habitus and fetal position.
Cervix Uterus Adnexa

Cervix
Length:           2.81  cm.
Normal appearance by transabdominal scan.

Uterus
No abnormality visualized.

Left Ovary
No adnexal mass visualized.

Right Ovary
No adnexal mass visualized.
Cul De Sac:   No free fluid seen.
Adnexa:       No abnormality visualized.
Impression

Singleton intrauterine pregnancy at 18+3 weeks with obesity
here for anatomic survey
Review of the anatomy shows no sonographic markers for
aneuploidy or structural anomalies
However, views of the fetal heart and face should be
considered suboptimal secondary to maternal body habitus
and fetal position
Amniotic fluid volume is normal
Estimated fetal weight shows growth in the 35th percentile
Recommendations

Recommend follow-up ultrasound examination in 4 weeks for
completion of the anatomic survey

## 2019-12-09 ENCOUNTER — Ambulatory Visit: Payer: Self-pay | Admitting: *Deleted

## 2019-12-09 NOTE — Telephone Encounter (Addendum)
Pt is experiencing unusual symptoms in her abdomen. Not pain, just an unusual sensation. Wants to speak with nurse, no PCP  [phone]   Called pt regarding fluttering on the sides of her abd. She took a pregnancy test and denies being pregnant a couple of days ago. She is on her period now. She has some nausea, has had diarrhea and constipation and now back to diarrhea. She feels bloated. Denies fever. She is advised to do an e visit with a provider.

## 2019-12-11 ENCOUNTER — Encounter (HOSPITAL_COMMUNITY): Payer: Self-pay | Admitting: Emergency Medicine

## 2019-12-11 ENCOUNTER — Emergency Department (HOSPITAL_COMMUNITY)
Admission: EM | Admit: 2019-12-11 | Discharge: 2019-12-11 | Disposition: A | Discharge status: Home / Self Care | Payer: Medicaid Other | Attending: Emergency Medicine | Admitting: Emergency Medicine

## 2019-12-11 ENCOUNTER — Telehealth: Payer: Medicaid Other | Admitting: Physician Assistant

## 2019-12-11 DIAGNOSIS — Z20822 Contact with and (suspected) exposure to covid-19: Secondary | ICD-10-CM | POA: Insufficient documentation

## 2019-12-11 DIAGNOSIS — R197 Diarrhea, unspecified: Secondary | ICD-10-CM | POA: Insufficient documentation

## 2019-12-11 DIAGNOSIS — J45909 Unspecified asthma, uncomplicated: Secondary | ICD-10-CM | POA: Insufficient documentation

## 2019-12-11 DIAGNOSIS — R112 Nausea with vomiting, unspecified: Secondary | ICD-10-CM

## 2019-12-11 LAB — SARS CORONAVIRUS 2 (TAT 6-24 HRS): SARS Coronavirus 2: NEGATIVE

## 2019-12-11 LAB — COMPREHENSIVE METABOLIC PANEL
ALT: 33 U/L (ref 0–44)
AST: 23 U/L (ref 15–41)
Albumin: 3.9 g/dL (ref 3.5–5.0)
Alkaline Phosphatase: 56 U/L (ref 38–126)
Anion gap: 9 (ref 5–15)
BUN: 11 mg/dL (ref 6–20)
CO2: 24 mmol/L (ref 22–32)
Calcium: 9.3 mg/dL (ref 8.9–10.3)
Chloride: 103 mmol/L (ref 98–111)
Creatinine, Ser: 0.69 mg/dL (ref 0.44–1.00)
GFR calc Af Amer: >60 mL/min (ref >60)
GFR calc non Af Amer: >60 mL/min (ref >60)
Glucose, Bld: 91 mg/dL (ref 70–99)
Potassium: 3.9 mmol/L (ref 3.5–5.1)
Sodium: 136 mmol/L (ref 135–145)
Total Bilirubin: 0.3 mg/dL (ref 0.3–1.2)
Total Protein: 6.8 g/dL (ref 6.5–8.1)

## 2019-12-11 LAB — URINALYSIS, ROUTINE W REFLEX MICROSCOPIC
Bilirubin Urine: NEGATIVE
Glucose, UA: NEGATIVE mg/dL
Hgb urine dipstick: NEGATIVE
Ketones, ur: NEGATIVE mg/dL
Leukocytes,Ua: NEGATIVE
Nitrite: NEGATIVE
Protein, ur: NEGATIVE mg/dL
Specific Gravity, Urine: 1.024 (ref 1.005–1.030)
pH: 6 (ref 5.0–8.0)

## 2019-12-11 LAB — CBC WITH DIFFERENTIAL/PLATELET
Abs Immature Granulocytes: 0.03 10*3/uL (ref 0.00–0.07)
Basophils Absolute: 0.1 10*3/uL (ref 0.0–0.1)
Basophils Relative: 1 %
Eosinophils Absolute: 0.2 10*3/uL (ref 0.0–0.5)
Eosinophils Relative: 3 %
HCT: 41.1 % (ref 36.0–46.0)
Hemoglobin: 13.1 g/dL (ref 12.0–15.0)
Immature Granulocytes: 0 %
Lymphocytes Relative: 35 %
Lymphs Abs: 2.9 10*3/uL (ref 0.7–4.0)
MCH: 28.2 pg (ref 26.0–34.0)
MCHC: 31.9 g/dL (ref 30.0–36.0)
MCV: 88.6 fL (ref 80.0–100.0)
Monocytes Absolute: 0.6 10*3/uL (ref 0.1–1.0)
Monocytes Relative: 8 %
Neutro Abs: 4.3 10*3/uL (ref 1.7–7.7)
Neutrophils Relative %: 53 %
Platelets: 287 10*3/uL (ref 150–400)
RBC: 4.64 MIL/uL (ref 3.87–5.11)
RDW: 12.8 % (ref 11.5–15.5)
WBC: 8.1 10*3/uL (ref 4.0–10.5)
nRBC: 0 % (ref 0.0–0.2)

## 2019-12-11 LAB — HIV ANTIBODY (ROUTINE TESTING W REFLEX): HIV Screen 4th Generation wRfx: NONREACTIVE

## 2019-12-11 LAB — WET PREP, GENITAL
Clue Cells Wet Prep HPF POC: NONE SEEN
Sperm: NONE SEEN
Trich, Wet Prep: NONE SEEN
Yeast Wet Prep HPF POC: NONE SEEN

## 2019-12-11 LAB — POC URINE PREG, ED: Preg Test, Ur: NEGATIVE

## 2019-12-11 LAB — LIPASE, BLOOD: Lipase: 32 U/L (ref 11–51)

## 2019-12-11 MED ORDER — ACETAMINOPHEN 325 MG PO TABS
650.0000 mg | ORAL_TABLET | Freq: Once | ORAL | Status: AC
  Administered 2019-12-11: 13:00:00 650 mg via ORAL
  Filled 2019-12-11: qty 2

## 2019-12-11 MED ORDER — ONDANSETRON 4 MG PO TBDP
8.0000 mg | ORAL_TABLET | Freq: Once | ORAL | Status: AC
  Administered 2019-12-11: 8 mg via ORAL
  Filled 2019-12-11: qty 2

## 2019-12-11 MED ORDER — SODIUM CHLORIDE 0.9 % IV BOLUS
1000.0000 mL | Freq: Once | INTRAVENOUS | Status: AC
  Administered 2019-12-11: 12:00:00 1000 mL via INTRAVENOUS

## 2019-12-11 MED ORDER — STERILE WATER FOR INJECTION IJ SOLN
INTRAMUSCULAR | Status: AC
  Filled 2019-12-11: qty 10

## 2019-12-11 MED ORDER — ONDANSETRON HCL 4 MG/2ML IJ SOLN
4.0000 mg | Freq: Once | INTRAMUSCULAR | Status: AC
  Administered 2019-12-11: 4 mg via INTRAVENOUS
  Filled 2019-12-11: qty 2

## 2019-12-11 MED ORDER — CEFTRIAXONE SODIUM 500 MG IJ SOLR
250.0000 mg | Freq: Once | INTRAMUSCULAR | Status: AC
  Administered 2019-12-11: 250 mg via INTRAMUSCULAR
  Filled 2019-12-11: qty 500

## 2019-12-11 MED ORDER — AZITHROMYCIN 250 MG PO TABS
1000.0000 mg | ORAL_TABLET | Freq: Once | ORAL | Status: AC
  Administered 2019-12-11: 1000 mg via ORAL
  Filled 2019-12-11: qty 4

## 2019-12-11 NOTE — ED Triage Notes (Signed)
Pt. Stated, Ive had N/V/D and a headache for 3 days.

## 2019-12-11 NOTE — ED Notes (Signed)
Patient Alert and oriented to baseline. Stable and ambulatory to baseline. Patient verbalized understanding of the discharge instructions.  Patient belongings were taken by the patient.   

## 2019-12-11 NOTE — Progress Notes (Signed)
Based on what you shared with me, I feel your condition warrants further evaluation and I recommend that you be seen for a face to face office visit.   NOTE: If you entered your credit card information for this eVisit, you will not be charged. You may see a "hold" on your card for the $35 but that hold will drop off and you will not have a charge processed.   If you are having a true medical emergency please call 911.      For an urgent face to face visit, [place] has five urgent care centers for your convenience:      NEW:  [facility] [place] Get Driving Directions [phone] [address] , [place] [address] . 10 am - 6pm Monday - Friday    [facility] Get Driving Directions [phone] [address] [place], [place] [address] . 10 am to 8 pm Monday-Friday . 12 pm to 8 pm Saturday-Sunday     [facility] Get Driving Directions [phone] [address], [address] [place], [place] [address] . 8 am to 8 pm Monday-Friday . 9 am to 6 pm Saturday . 11 am to 6 pm Sunday     [facility] [place] Get Driving Directions  [phone] [address] [place], [place] [address] . 8 am to 8 pm Monday-Friday . 8 am to 4 pm Saturday-Sunday   [facility] [place] Get Driving Directions [phone] [address] [place], [place] [address] . 12 pm to 6 pm Monday-Friday      Your e-visit answers were reviewed by a board certified advanced clinical practitioner to complete your personal care plan.  Thank you for using e-Visits.    Breckin Zafar PA-C  Approximately 5 minutes was spent documenting and reviewing patient's chart.   

## 2019-12-11 NOTE — ED Provider Notes (Signed)
[facility] EMERGENCY DEPARTMENT Provider Note   CSN: [id number] Arrival date & time: 12/11/19  1147     History Chief Complaint  Patient presents with  . Nausea  . Emesis  . Headache    Anna Choi is a 24 y.o. female with past medical history of asthma, GERD presenting to emergency department today with chief complaint of nausea, vomiting, diarrhea and headache x3 days. She is also reporting generalized abdominal pain. It feels like a dull cramping sensation. The cramping has been intermittent. She has had 8 episodes of nonbloody diarrhea in the last 24 hours.  She has had one episode of nonbloody nonbilious emesis in the last 24 hours. She has had decreased appetite but can tolerate PO fluids. She has a history of headaches and states this feels the same.  Denies any associated neck pain.  The headache has been constant x3 days.  She says it has progressively worsened since onset.  She has not taken any medications for her symptoms prior to arrival.   She denies any sick contacts.  She does not think she has had any contact with any Covid positive. Denies any suspicious food intake. Her LMP was on 12/05/2019.  She states it was shorter than normal. She denies fever, chills, chest pain, shortness of breath, cough, congestion, urinary frequency, gross hematuria, dysuria, vaginal discharge, pelvic pain, abnormal vaginal bleeding.  She is also denying any syncope, head trauma, photophobia, phonophobia, visual changes,or thunderclap onset of headache, rash.  Denies abdominal surgical history.     Past Medical History:  Diagnosis Date  . Asthma     Patient Active Problem List   Diagnosis Date Noted  . Post-dates pregnancy 08/01/2018  . GERD (gastroesophageal reflux disease) 05/07/2018  . 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 Asthma 02/11/2018  . Low grade squamous intraepith lesion on cytologic smear cervix (lgsil) 02/11/2018  . Supervision of normal first  pregnancy, antepartum 01/21/2018    Past Surgical History:  Procedure Laterality Date  . CESAREAN SECTION N/A 08/02/2018   Procedure: CESAREAN SECTION;  Surgeon: Cyrus Bing, MD;  Location: [facility] BIRTHING SUITES;  Service: Obstetrics;  Laterality: N/A;  . TONSILLECTOMY    . TYMPANOSTOMY TUBE PLACEMENT       OB History    Gravida  1   Para  1   Term  1   Preterm      AB      Living  1     SAB      TAB      Ectopic      Multiple  0   Live Births  1           No family history on file.  Social History   Tobacco Use  . Smoking status: Never Smoker  . Smokeless tobacco: Never Used  Substance Use Topics  . Alcohol use: No  . Drug use: No    Home Medications Prior to Admission medications   Not on File    Allergies    Patient has no known allergies.  Review of Systems   Review of Systems  All other systems are reviewed and are negative for acute change except as noted in the HPI.   Physical Exam Updated Vital Signs BP 126/85 (BP Location: Left Arm)   Pulse 87   Temp 98.2 F (36.8 C) (Oral)   Resp 14   LMP 12/05/2019   SpO2 100%   Physical Exam Vitals and nursing note reviewed.  Constitutional:  General: She is not in acute distress.    Appearance: She is not ill-appearing.  HENT:     Head: Normocephalic and atraumatic.     Comments: No sinus or temporal tenderness.    Right Ear: Tympanic membrane and external ear normal.     Left Ear: Tympanic membrane and external ear normal.     Nose: Nose normal.     Mouth/Throat:     Mouth: Mucous membranes are moist.     Pharynx: Oropharynx is clear.  Eyes:     General: No scleral icterus.       Right eye: No discharge.        Left eye: No discharge.     Extraocular Movements: Extraocular movements intact.     Conjunctiva/sclera: Conjunctivae normal.     Pupils: Pupils are equal, round, and reactive to light.  Neck:     Vascular: No JVD.  Cardiovascular:     Rate and Rhythm: Normal  rate and regular rhythm.     Pulses: Normal pulses.          Radial pulses are 2+ on the right side and 2+ on the left side.     Heart sounds: Normal heart sounds.  Pulmonary:     Comments: Lungs clear to auscultation in all fields. Symmetric chest rise. No wheezing, rales, or rhonchi. Abdominal:     Comments: Abdomen is soft, non-distended, and non-tender in all quadrants. No rigidity, no guarding. No peritoneal signs.  Genitourinary:    Comments: Normal external genitalia. No pain with speculum insertion. Closed cervical os with normal appearance - no rash or lesions. No significant discharge or bleeding noted from cervix or in vaginal vault. On bimanual examination no adnexal tenderness or cervical motion tenderness. Chaperone Marissa EMT present during exam.  Musculoskeletal:        General: Normal range of motion.     Cervical back: Normal range of motion.  Skin:    General: Skin is warm and dry.     Capillary Refill: Capillary refill takes less than 2 seconds.  Neurological:     Mental Status: She is oriented to person, place, and time.     GCS: GCS eye subscore is 4. GCS verbal subscore is 5. GCS motor subscore is 6.     Comments: Speech is clear and goal oriented, follows commands CN III-XII intact, no facial droop Normal strength in upper and lower extremities bilaterally including dorsiflexion and plantar flexion, strong and equal grip strength Sensation normal to light and sharp touch Moves extremities without ataxia, coordination intact Normal finger to nose and rapid alternating movements Normal gait and balance   Psychiatric:        Behavior: Behavior normal.       ED Results / Procedures / Treatments   Labs (all labs ordered are listed, but only abnormal results are displayed) Labs Reviewed  WET PREP, GENITAL - Abnormal; Notable for the following components:      Result Value   WBC, Wet Prep HPF POC FEW (*)    All other components within normal limits  SARS  CORONAVIRUS 2 (TAT 6-24 HRS)  URINALYSIS, ROUTINE W REFLEX MICROSCOPIC  COMPREHENSIVE METABOLIC PANEL  CBC WITH DIFFERENTIAL/PLATELET  LIPASE, BLOOD  RPR  HIV ANTIBODY (ROUTINE TESTING W REFLEX)  POC URINE PREG, ED  GC/CHLAMYDIA PROBE AMP ([place]) NOT AT [facility]    EKG None  Radiology No results found.  Procedures Procedures (including critical care time)  Medications Ordered in ED  Medications  sterile water (preservative free) injection (has no administration in time range)  sodium chloride 0.9 % bolus 1,000 mL (1,000 mLs Intravenous New Bag/Given 12/11/19 1224)  ondansetron (ZOFRAN) injection 4 mg (4 mg Intravenous Given 12/11/19 1320)  acetaminophen (TYLENOL) tablet 650 mg (650 mg Oral Given 12/11/19 1319)  cefTRIAXone (ROCEPHIN) injection 250 mg (250 mg Intramuscular Given 12/11/19 1516)  azithromycin (ZITHROMAX) tablet 1,000 mg (1,000 mg Oral Given 12/11/19 1517)  ondansetron (ZOFRAN-ODT) disintegrating tablet 8 mg (8 mg Oral Given 12/11/19 1517)    ED Course  I have reviewed the triage vital signs and the nursing notes.  Pertinent labs & imaging results that were available during my care of the patient were reviewed by me and considered in my medical decision making (see chart for details).    MDM Rules/Calculators/A&P                      Patient presents to the ED with complaints of abdominal pain. Patient nontoxic appearing, in no apparent distress, vitals WNL.  Patient is reporting generalized abdominal tenderness however on exam abdomen is nontender. No peritoneal signs. Will evaluate with labs.  Neuro exam is normal. Labs reviewed and grossly unremarkable. No leukocytosis, no anemia, no significant electrolyte derangements. LFTs, renal function, and lipase WNL. Urinalysis without obvious infection.  Pregnancy test is negative.  Exam also performed as patient has generalized abdominal pain but does mention that it is in the bilateral lower quadrants.  Exam was  chaperoned.  No significant findings on exam.  There was no discharge or bleeding noted. No cervical motion tenderness, no adnexal tenderness.  Wet prep is negative for yeast, trichomoniasis, clue cells.  Will treat prophylactically for STDs.  Patient is aware she will need to notify partner if positive as they will need treatment as well. On repeat abdominal exam patient remains without peritoneal signs, doubt cholecystitis, pancreatitis, diverticulitis, appendicitis, bowel obstruction/perforation, PID, or ectopic pregnancy. Patient tolerating PO in the emergency department.  Do not feel emergent imaging is needed at this time. Given her very well appearance and unremarkable work-up today feel that she can be discharged home with symptomatic care.  Covid PCR test performed.  Patient aware she will need to quarantine until she has the test result. I discussed results, treatment plan, need for PCP follow-up, and return precautions with the patient. Provided opportunity for questions, patient confirmed understanding and is in agreement with plan.   Anna Choi was evaluated in Emergency Department on 12/11/2019 for the symptoms described in the history of present illness. She was evaluated in the context of the global COVID-19 pandemic, which necessitated consideration that the patient might be at risk for infection with the SARS-CoV-2 virus that causes COVID-19. Institutional protocols and algorithms that pertain to the evaluation of patients at risk for COVID-19 are in a state of rapid change based on information released by regulatory bodies including the CDC and federal and state organizations. These policies and algorithms were followed during the patient's care in the ED.   Portions of this note were generated with Scientist, clinical (histocompatibility and immunogenetics). Dictation errors may occur despite best attempts at proofreading.   Final Clinical Impression(s) / ED Diagnoses Final diagnoses:  Nausea vomiting and  diarrhea    Rx / DC Orders ED Discharge Orders    None       Sherene Sires, PA-C 12/11/19 1531    Derwood Kaplan, MD 12/12/19 1214

## 2019-12-11 NOTE — Discharge Instructions (Addendum)
You were seen in the ED for nausea, vomiting, diarrhea, headache.     Your blood work today was normal.  Your pregnancy test was negative.  Your urine does not show infection.   It is possible you have a virus.  We tested your for COVID-19 (coronavirus) infection.  It is also possible you could have other viral upper respiratory infection from another virus.    Test results come back in 48 hours, sometimes sooner.  Someone will call you if ypu are positive for COVID. If the result is negative you can see it on your MyChart.  Treatment of your illness and symptoms will include self-isolation, monitoring of symptoms and supportive care with over-the-counter medicines.    Return to the ED if there is increased work of breathing, shortness of breath, inability to tolerate fluids, weakness, chest pain.  If your test results are POSITIVE, the following isolation requirements need to be met to return to work and resume essential activities: At least 10 days since symptom onset  72 hours of absence of fever without antifever medicine (ibuprofen, acetaminophen). A fever is temperature of 100.8F or greater. Improvement of respiratory symptoms  If your test is NEGATIVE, you may return to work and essential activities as long as your symptoms have improved and you do not have a fever for a total of 3 days.  Call your job and notify them that your test result was negative to see if they will allow you to return to work.   Stay well-hydrated. Rest. You can use over the counter medications to help with symptoms: 600 mg ibuprofen (motrin, aleve, advil) or acetaminophen (tylenol) every 6 hours, around the clock to help with associated fevers, sore throat, headaches, generalized body aches and malaise.  Oxymetazoline (afrin) intranasal spray once daily for no more than 3 days to help with congestion, after 3 days you can switch to another over-the-counter nasal steroid spray such as fluticasone  (flonase) Allergy medication (loratadine, cetirizine, etc) and phenylephrine (sudafed) help with nasal congestion, runny nose and postnasal drip.   Dextromethorphan (Delsym) to suppress dry cough. Frequent coughing is likely causing your chest wall pain Wash your hands often to prevent spread.

## 2019-12-12 LAB — RPR: RPR Ser Ql: NONREACTIVE

## 2019-12-13 LAB — GC/CHLAMYDIA PROBE AMP (~~LOC~~) NOT AT ARMC
Chlamydia: NEGATIVE
Neisseria Gonorrhea: NEGATIVE

## 2019-12-16 ENCOUNTER — Telehealth: Payer: Medicaid Other | Admitting: Physician Assistant

## 2019-12-16 ENCOUNTER — Other Ambulatory Visit: Payer: Self-pay

## 2019-12-16 ENCOUNTER — Encounter (HOSPITAL_COMMUNITY): Payer: Self-pay | Admitting: Emergency Medicine

## 2019-12-16 ENCOUNTER — Emergency Department (HOSPITAL_COMMUNITY): Payer: Medicaid Other

## 2019-12-16 ENCOUNTER — Emergency Department (HOSPITAL_COMMUNITY)
Admission: EM | Admit: 2019-12-16 | Discharge: 2019-12-16 | Disposition: A | Discharge status: Home / Self Care | Payer: Medicaid Other | Attending: Emergency Medicine | Admitting: Emergency Medicine

## 2019-12-16 DIAGNOSIS — R112 Nausea with vomiting, unspecified: Secondary | ICD-10-CM | POA: Diagnosis not present

## 2019-12-16 DIAGNOSIS — R10811 Right upper quadrant abdominal tenderness: Secondary | ICD-10-CM | POA: Insufficient documentation

## 2019-12-16 DIAGNOSIS — R197 Diarrhea, unspecified: Secondary | ICD-10-CM | POA: Insufficient documentation

## 2019-12-16 LAB — CBC
HCT: 41.3 % (ref 36.0–46.0)
Hemoglobin: 13.2 g/dL (ref 12.0–15.0)
MCH: 28.3 pg (ref 26.0–34.0)
MCHC: 32 g/dL (ref 30.0–36.0)
MCV: 88.4 fL (ref 80.0–100.0)
Platelets: 347 10*3/uL (ref 150–400)
RBC: 4.67 MIL/uL (ref 3.87–5.11)
RDW: 12.6 % (ref 11.5–15.5)
WBC: 6.8 10*3/uL (ref 4.0–10.5)
nRBC: 0 % (ref 0.0–0.2)

## 2019-12-16 LAB — COMPREHENSIVE METABOLIC PANEL
ALT: 38 U/L (ref 0–44)
AST: 29 U/L (ref 15–41)
Albumin: 4.1 g/dL (ref 3.5–5.0)
Alkaline Phosphatase: 58 U/L (ref 38–126)
Anion gap: 8 (ref 5–15)
BUN: 9 mg/dL (ref 6–20)
CO2: 25 mmol/L (ref 22–32)
Calcium: 9.4 mg/dL (ref 8.9–10.3)
Chloride: 105 mmol/L (ref 98–111)
Creatinine, Ser: 0.78 mg/dL (ref 0.44–1.00)
GFR calc Af Amer: >60 mL/min (ref >60)
GFR calc non Af Amer: >60 mL/min (ref >60)
Glucose, Bld: 89 mg/dL (ref 70–99)
Potassium: 4 mmol/L (ref 3.5–5.1)
Sodium: 138 mmol/L (ref 135–145)
Total Bilirubin: 0.4 mg/dL (ref 0.3–1.2)
Total Protein: 7.2 g/dL (ref 6.5–8.1)

## 2019-12-16 LAB — URINALYSIS, ROUTINE W REFLEX MICROSCOPIC
Bilirubin Urine: NEGATIVE
Glucose, UA: NEGATIVE mg/dL
Hgb urine dipstick: NEGATIVE
Ketones, ur: NEGATIVE mg/dL
Leukocytes,Ua: NEGATIVE
Nitrite: NEGATIVE
Protein, ur: NEGATIVE mg/dL
Specific Gravity, Urine: 1.025 (ref 1.005–1.030)
pH: 6 (ref 5.0–8.0)

## 2019-12-16 LAB — I-STAT BETA HCG BLOOD, ED (MC, WL, AP ONLY): I-stat hCG, quantitative: <5 m[IU]/mL (ref <5)

## 2019-12-16 LAB — LIPASE, BLOOD: Lipase: 26 U/L (ref 11–51)

## 2019-12-16 MED ORDER — ONDANSETRON 4 MG PO TBDP: 4.0000 mg | ORAL_TABLET | Freq: Three times a day (TID) | ORAL | 0 refills | Status: AC | PRN

## 2019-12-16 MED ORDER — ONDANSETRON HCL 4 MG/2ML IJ SOLN
4.0000 mg | Freq: Once | INTRAMUSCULAR | Status: AC
  Administered 2019-12-16: 4 mg via INTRAVENOUS

## 2019-12-16 MED ORDER — ONDANSETRON 4 MG PO TBDP: 4.0000 mg | ORAL_TABLET | Freq: Three times a day (TID) | ORAL | 0 refills | Status: DC | PRN

## 2019-12-16 MED ORDER — SODIUM CHLORIDE 0.9 % IV BOLUS
1000.0000 mL | Freq: Once | INTRAVENOUS | Status: AC
  Administered 2019-12-16: 1000 mL via INTRAVENOUS

## 2019-12-16 NOTE — Discharge Instructions (Signed)
Please take your prescribed Zofran ODT as needed for your nausea symptoms.  Please call [facility] to schedule an appointment for ongoing evaluation and management of your nausea, vomiting, and loose stools should your symptoms fail to improve with conservative therapy.  Please return to the ED or seek medical attention immediately for any new or worsening symptoms.

## 2019-12-16 NOTE — ED Triage Notes (Signed)
Pt seen her Saturday for N/V/D but states the symptoms have gotten worse. Still reports abd pain. States she is unable to keep any water or Gatorade down.

## 2019-12-16 NOTE — Progress Notes (Signed)
Based on what you shared with me, I feel your condition warrants further evaluation and I recommend that you be seen for a face to face office visit.  I did review your visit to the hospital on December 11, 2019.  I am concerned about your continued vomiting and diarrhea.  Especially given that you had abdominal pain at that time and it took multiple rounds of medications to get your vomiting under control.  Your Covid test did return negative.  I think it important that you be reevaluated in a face-to-face evaluation.  You may return to the emergency department or to an urgent care center.   NOTE: If you entered your credit card information for this eVisit, you will not be charged. You may see a "hold" on your card for the $35 but that hold will drop off and you will not have a charge processed.   If you are having a true medical emergency please call 911.      For an urgent face to face visit, [facility] has five urgent care centers for your convenience:      NEW:  [facility] at [facility] Directions [phone] [address] [place], [place] [address] . 10 am - 6pm Monday - Friday    [facility]) Get Driving Directions [phone] [address] [place], [place] [address] . 10 am to 8 pm Monday-Friday . 12 pm to 8 pm [facility] Urgent Care at [facility] Get Driving Directions [phone] [address] [place], [place] [address] . 8 am to 8 pm Monday-Friday . 9 am to 6 pm Saturday . 11 am to 6 pm Sunday     [facility] at [facility] Get Driving Directions  [phone] [address] [place], [place] [address] . 8 am to 8 pm Monday-Friday . 8 am to 4 pm [facility] at [facility] Directions [phone] [address] [place], [place] [address] . 12 pm to 6 pm  Monday-Friday      Your e-visit answers were reviewed by a board certified advanced clinical practitioner to complete your personal care plan.  Thank you for using e-Visits.   Greater than 5 minutes, yet less than 10 minutes of time have been spent researching, coordinating, and implementing care for this patient today

## 2019-12-16 NOTE — ED Provider Notes (Addendum)
[facility] EMERGENCY DEPARTMENT Provider Note   CSN: [id number] Arrival date & time: 12/16/19  1103     History Chief Complaint  Patient presents with  . Emesis  . Diarrhea    Anna Choi is a 24 y.o. female with no significant PMH who presents to the ED with 6-day history of progressively worsening N/V/D.  Patient reports that it all began after she ate a [employer] sandwich.  She also endorses intermittent right upper quadrant discomfort, but denies any associated pattern, aggravators, or alleviators.  She presented to the ED 12/11/2019 where her lab work and physical exam findings are reassuring.  She was treated empirically for STD and discharged home after she was successfully p.o. challenged.  On my exam, she states that she has not been able to keep anything down for approximately 2 days.  She was not discharged home with any Zofran ODT.  She denies any fevers or chills, headache or dizziness, chest pain or shortness of breath, cough, bloody emesis, bloody diarrhea, vaginal discharge, or urinary symptoms.  She does not have her next appointment with her GYN provider until 01/18/2020.  Her son also had a few loose stools after that meal, but his symptoms resolved very shortly thereafter.  She is no longer breast-feeding.   HPI     Past Medical History:  Diagnosis Date  . Asthma     Patient Active Problem List   Diagnosis Date Noted  . Post-dates pregnancy 08/01/2018  . GERD (gastroesophageal reflux disease) 05/07/2018  . 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 Asthma 02/11/2018  . Low grade squamous intraepith lesion on cytologic smear cervix (lgsil) 02/11/2018  . Supervision of normal first pregnancy, antepartum 01/21/2018    Past Surgical History:  Procedure Laterality Date  . CESAREAN SECTION N/A 08/02/2018   Procedure: CESAREAN SECTION;  Surgeon: [place] Bing, MD;  Location: [facility] BIRTHING SUITES;  Service: Obstetrics;  Laterality: N/A;  . TONSILLECTOMY     . TYMPANOSTOMY TUBE PLACEMENT       OB History    Gravida  1   Para  1   Term  1   Preterm      AB      Living  1     SAB      TAB      Ectopic      Multiple  0   Live Births  1           No family history on file.  Social History   Tobacco Use  . Smoking status: Never Smoker  . Smokeless tobacco: Never Used  Substance Use Topics  . Alcohol use: No  . Drug use: No    Home Medications Prior to Admission medications   Medication Sig Start Date End Date Taking? Authorizing Provider  ondansetron (ZOFRAN ODT) 4 MG disintegrating tablet Take 1 tablet (4 mg total) by mouth every 8 (eight) hours as needed for nausea or vomiting. 12/16/19   Lorelee New, PA-C    Allergies    Patient has no known allergies.  Review of Systems   Review of Systems  All other systems reviewed and are negative.   Physical Exam Updated Vital Signs BP 119/81 (BP Location: Left Arm)   Pulse 72   Temp 98.7 F (37.1 C) (Oral)   Resp 16   Ht 5\' 4"  (1.626 m)   LMP 12/05/2019   SpO2 100%   BMI 32.61 kg/m   Physical Exam Vitals and nursing note reviewed.  Exam conducted with a chaperone present.  Constitutional:      Appearance: Normal appearance.  HENT:     Head: Normocephalic and atraumatic.  Eyes:     General: No scleral icterus.    Conjunctiva/sclera: Conjunctivae normal.  Cardiovascular:     Rate and Rhythm: Normal rate and regular rhythm.     Pulses: Normal pulses.     Heart sounds: Normal heart sounds.  Pulmonary:     Effort: Pulmonary effort is normal.     Breath sounds: Normal breath sounds.  Abdominal:     Comments: Soft, nondistended.  Moderate TTP in RUQ.  Questionable Murphy sign.  No TTP elsewhere.  No guarding.  No overlying skin changes.  No mass appreciated.  Skin:    General: Skin is dry.     Capillary Refill: Capillary refill takes less than 2 seconds.  Neurological:     Mental Status: She is alert and oriented to person, place, and time.       GCS: GCS eye subscore is 4. GCS verbal subscore is 5. GCS motor subscore is 6.  Psychiatric:        Mood and Affect: Mood normal.        Behavior: Behavior normal.        Thought Content: Thought content normal.     ED Results / Procedures / Treatments   Labs (all labs ordered are listed, but only abnormal results are displayed) Labs Reviewed  LIPASE, BLOOD  COMPREHENSIVE METABOLIC PANEL  CBC  URINALYSIS, ROUTINE W REFLEX MICROSCOPIC  I-STAT BETA HCG BLOOD, ED ([facility], [facility], [facility] ONLY)    EKG None  Radiology US Abdomen Limited  Result Date: 12/16/2019 CLINICAL DATA:  Abdominal pain. EXAM: ULTRASOUND ABDOMEN LIMITED RIGHT UPPER QUADRANT COMPARISON:  None. FINDINGS: Gallbladder: No gallstones or wall thickening visualized. No sonographic Murphy sign noted by sonographer. Common bile duct: Diameter: 3 mm Liver: Increased parenchymal echogenicity. Lower echogenicity adjacent to the gallbladder consistent with focal fatty sparing. No mass or focal lesion. Portal vein is patent on color Doppler imaging with normal direction of blood flow towards the liver. Other: None. IMPRESSION: 1. No acute findings.  Normal gallbladder.  No bile duct dilation. 2. Hepatic steatosis. Electronically Signed   By: Lajean Manes M.D.   On: 12/16/2019 15:34    Procedures Procedures (including critical care time)  Medications Ordered in ED Medications  sodium chloride 0.9 % bolus 1,000 mL (0 mLs Intravenous Stopped 12/16/19 1540)  ondansetron (ZOFRAN) injection 4 mg (4 mg Intravenous Given 12/16/19 1427)    ED Course  I have reviewed the triage vital signs and the nursing notes.  Pertinent labs & imaging results that were available during my care of the patient were reviewed by me and considered in my medical decision making (see chart for details).    MDM Rules/Calculators/A&P                      While patient reports a 6-day history of difficulty tolerating p.o., her CMP does not demonstrate any  electrolyte abnormalities and she is hemodynamically stable.  All of her lab work today is within normal limits.  Given her reported loose stools in addition to nausea and vomiting, particularly with consumption of food, in addition to her RUQ tenderness on physical exam will obtain limited RUQ ultrasound to assess for cholecystitis.  Given benign remainder of abdominal exam, do not feel as though CT abdomen pelvis is warranted if ultrasound is negative.  I have a very low suspicion for appendicitis, ischemic bowel, bowel perforation, or any other life threatening disease.    US abdomen limited was negative for any biliary duct dilation or other findings that would be otherwise concerning for cholecystitis.  Will p.o. challenge patient here in the ED and then treat with Zofran ODT at home.  Patient was able to eat several saltine crackers and graham crackers without any difficulty.  She denies any nausea symptoms.  Will refer her to gastroenterology for ongoing evaluation and management should her symptoms fail to improve with conservative therapy.  Patient is in no acute distress on my examination and has been nauseated while here in the ED.    Strict return precautions discussed.  All of the evaluation and work-up results were discussed with the patient and any family at bedside. They were provided opportunity to ask any additional questions and have none at this time. They have expressed understanding of verbal discharge instructions as well as return precautions and are agreeable to the plan.    Final Clinical Impression(s) / ED Diagnoses Final diagnoses:  Nausea vomiting and diarrhea    Rx / DC Orders ED Discharge Orders         Ordered    ondansetron (ZOFRAN ODT) 4 MG disintegrating tablet  Every 8 hours PRN,   Status:  Discontinued     12/16/19 1640    ondansetron (ZOFRAN ODT) 4 MG disintegrating tablet  Every 8 hours PRN     12/16/19 1643           Lorelee New, PA-C 12/16/19  1650    Lorelee New, PA-C 12/16/19 1654    Arby Barrette, MD 12/17/19 [phone]

## 2019-12-16 NOTE — ED Notes (Signed)
Transported to US.

## 2019-12-16 NOTE — ED Notes (Signed)
ED Provider at bedside. 

## 2019-12-16 NOTE — ED Notes (Signed)
Patient given discharge instructions patient verbalizes understanding. 

## 2020-01-11 IMAGING — US US FETAL BPP W/ NON-STRESS
1 series · 13 of 14 positions shown · non-contrast
Comparison: none

[Series 1: us fetal bpp w/nonstress · 14 acquisitions, 13 frames shown]
[im 1/14]
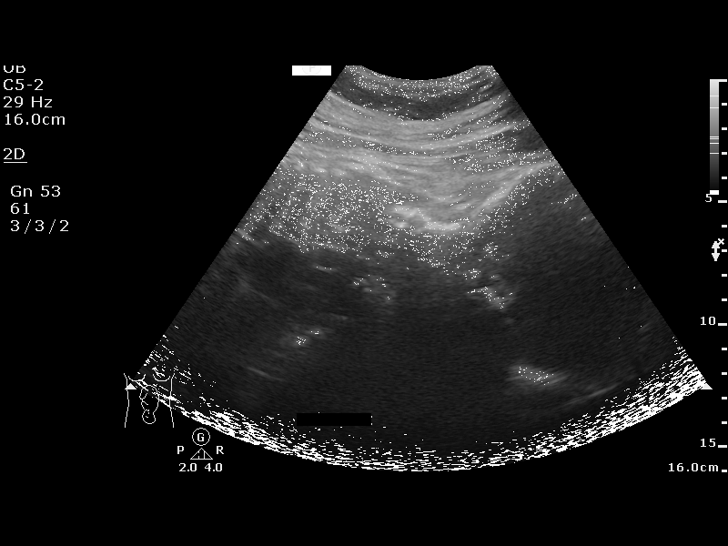
[im 2/14]
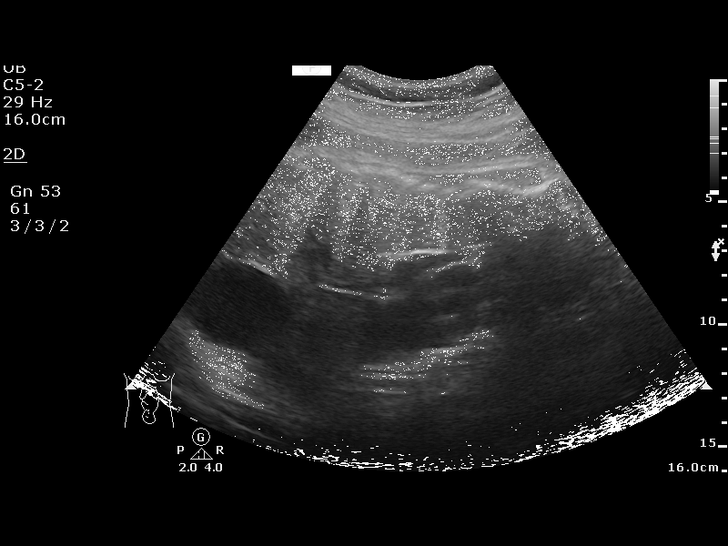
[im 3/14]
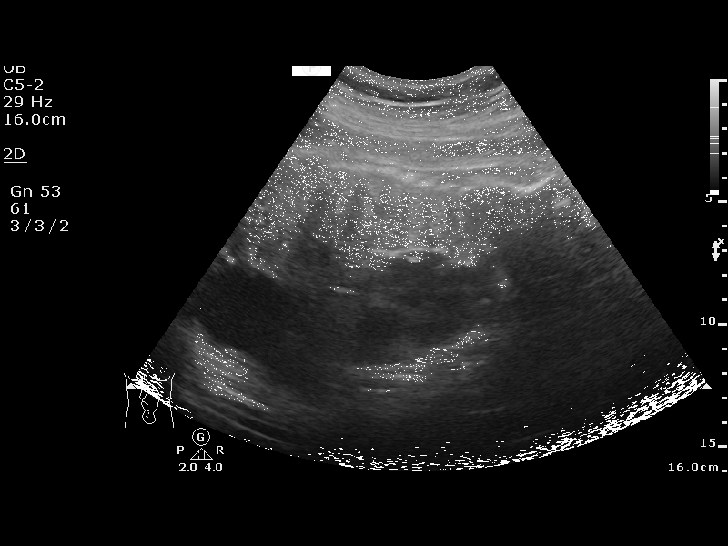
[im 4/14]
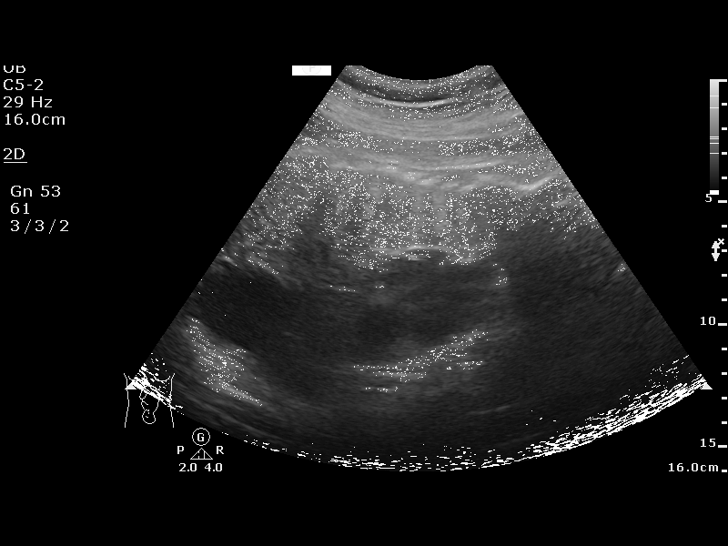
[im 5/14]
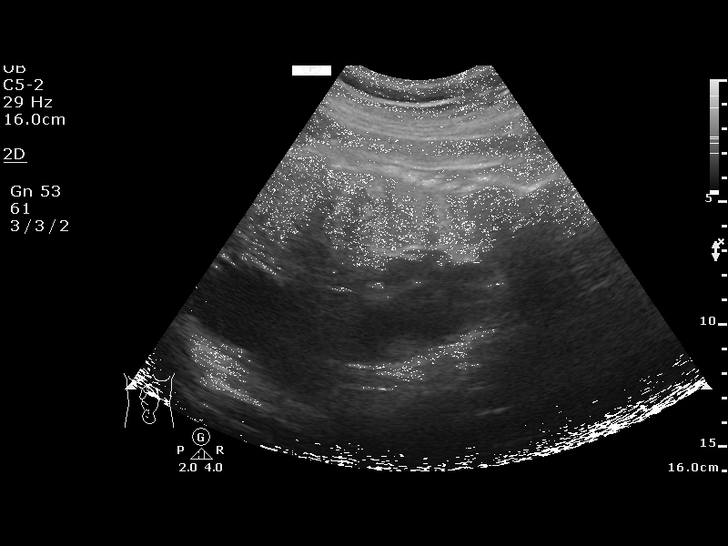
[im 6/14]
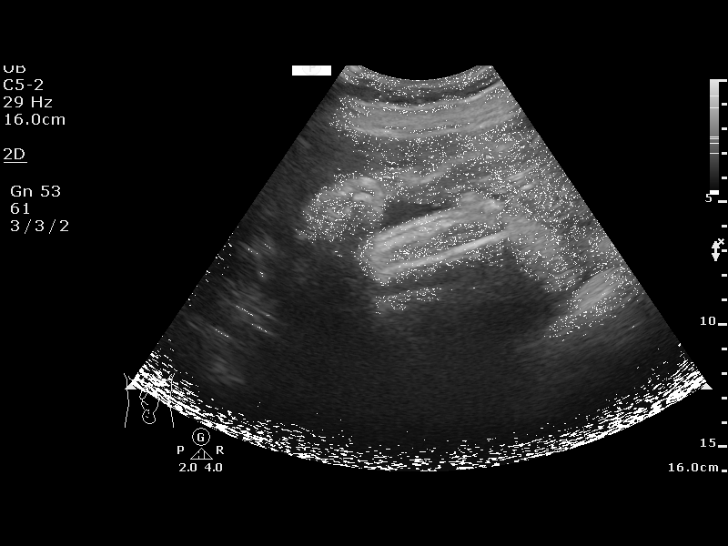
[im 8/14]
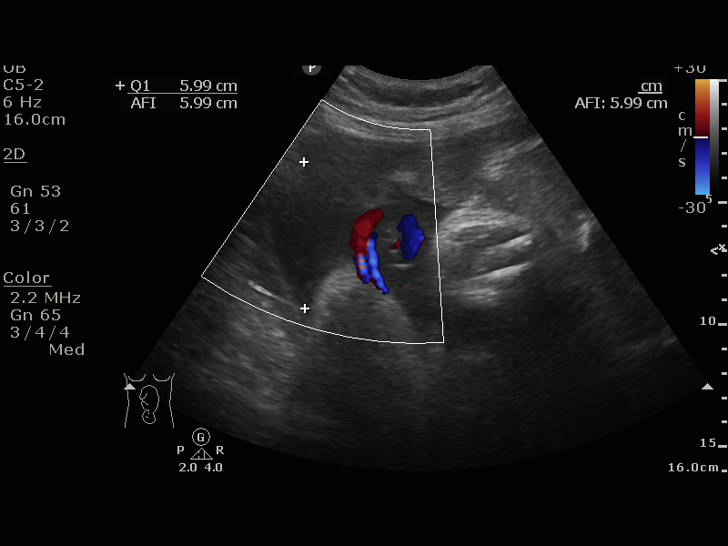
[im 9/14]
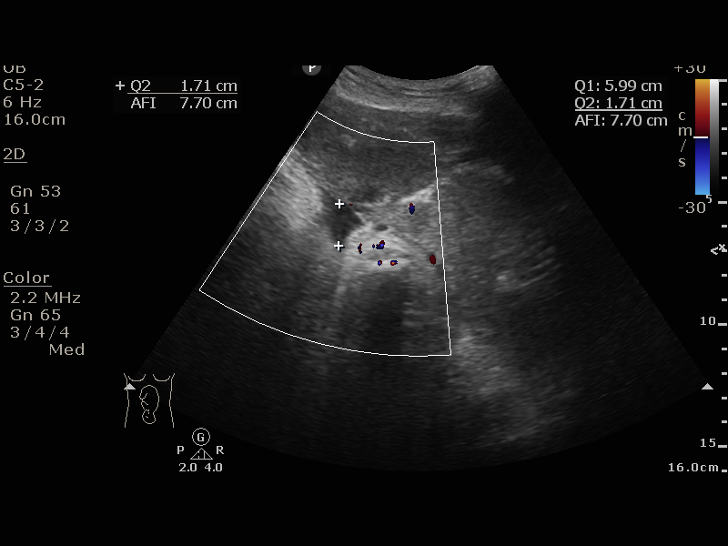
[im 10/14]
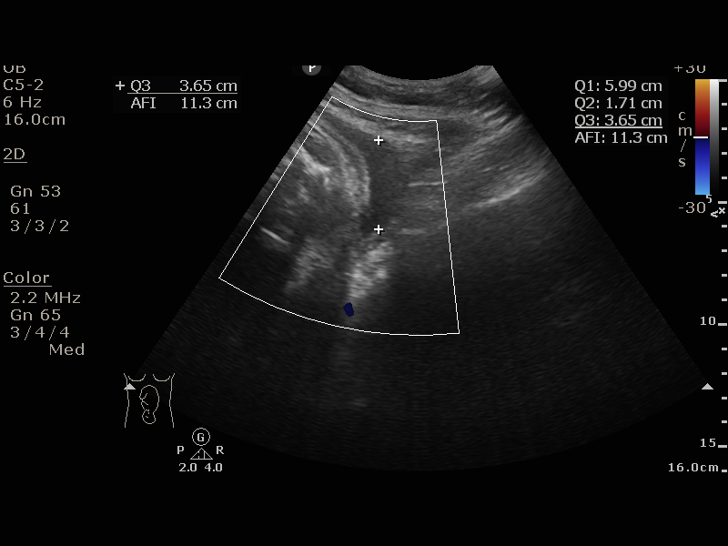
[im 11/14]
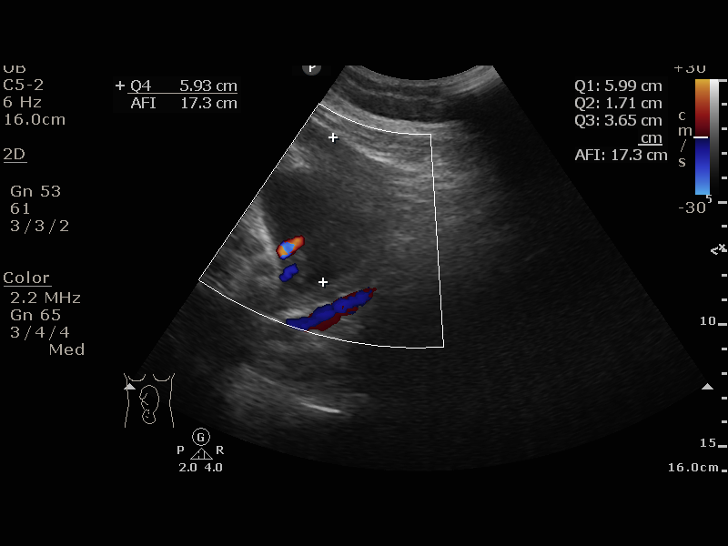
[im 12/14]
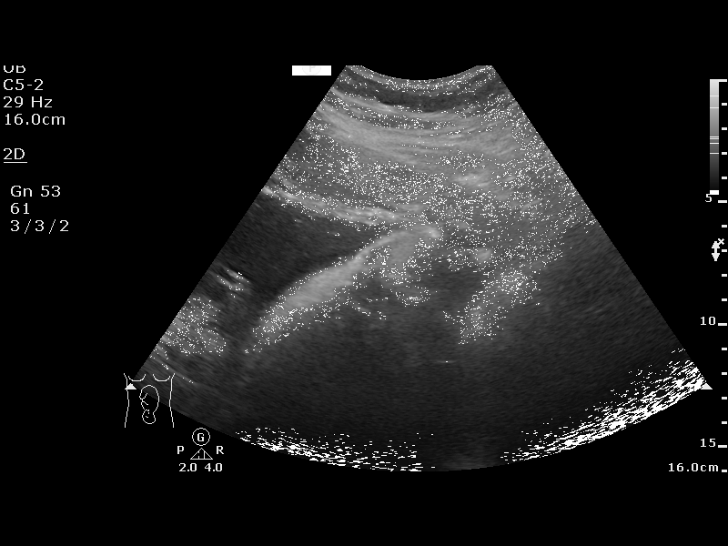
[im 13/14]
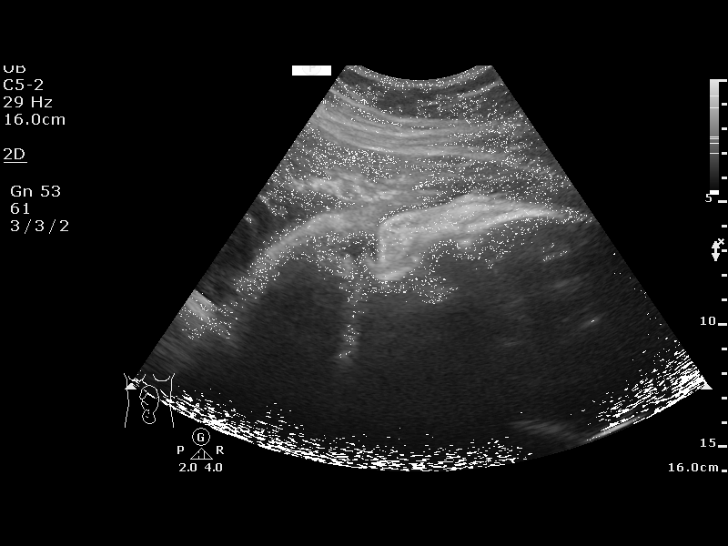
[im 14/14]
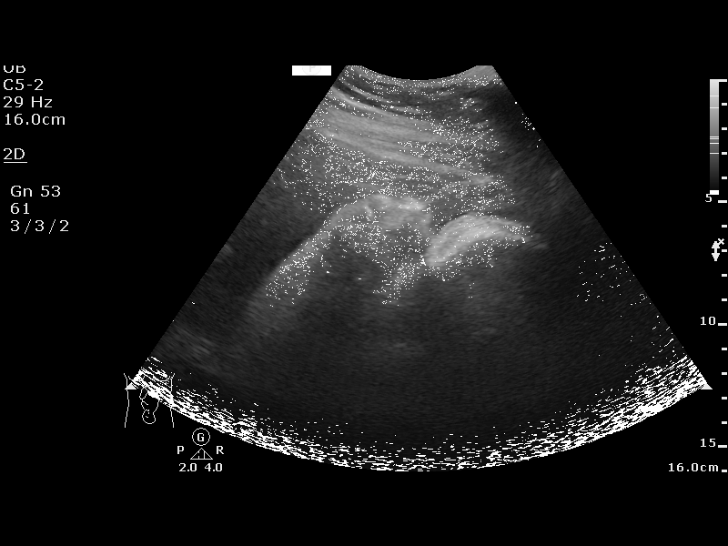

[13 of 14 positions shown; findings below may reference images not displayed]

OB/Gyn Clinic
Attending:        Judd Bun         [facility]:          [facility]
[REDACTED]

1  US FETAL BPP W/NONSTRESS             76818.4      RASHEED PETERSEN

Service(s) Provided

Indications

40 weeks gestation of pregnancy
Postdate pregnancy (40-42 weeks)
Vital Signs

Height:        5'3"
Fetal Evaluation

Num Of Fetuses:          1
Preg. Location:          Intrauterine
Cardiac Activity:        Observed
Presentation:            Cephalic

Amniotic Fluid
AFI FV:      Within normal limits

AFI Sum(cm)     %Tile       Largest Pocket(cm)
17.28           79

RUQ(cm)       RLQ(cm)       LUQ(cm)        LLQ(cm)
5.99
Biophysical Evaluation

Amniotic F.V:   Pocket => 2 cm two         F. Tone:         Observed
planes
F. Movement:    Observed                   N.S.T:           Reactive
F. Breathing:   Observed                   Score:           [DATE]
OB History

Gravidity:    1         Term:   0        Prem:   0        SAB:   0
TOP:          0       Ectopic:  0        Living: 0
Gestational Age

LMP:           40w 4d        Date:  10/18/17                 EDD:   07/25/18
Best:          40w 4d     Det. By:  LMP  (10/18/17)          EDD:   07/25/18
Impression

Normal amniotic fluid volume.
Antenatal testing is reassuring. BPP [DATE].
Recommendations

Delivery at 41 weeks as indicated

## 2020-01-18 ENCOUNTER — Ambulatory Visit: Payer: Medicaid Other | Admitting: Student

## 2020-07-25 ENCOUNTER — Ambulatory Visit: Payer: Medicaid Other | Admitting: Advanced Practice Midwife

## 2021-05-30 IMAGING — US US ABDOMEN LIMITED
1 series · 14 of 25 positions shown · non-contrast
Comparison: None.

CLINICAL DATA: Abdominal pain.

EXAM:
ULTRASOUND ABDOMEN LIMITED RIGHT UPPER QUADRANT

[Series 1: us abdomen limited · 14 of 51 slices shown]
[im 1/51]
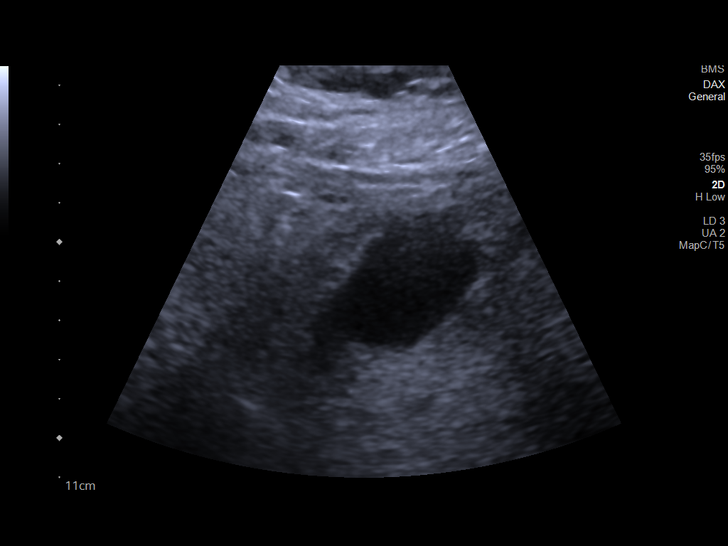
[im 5/51]
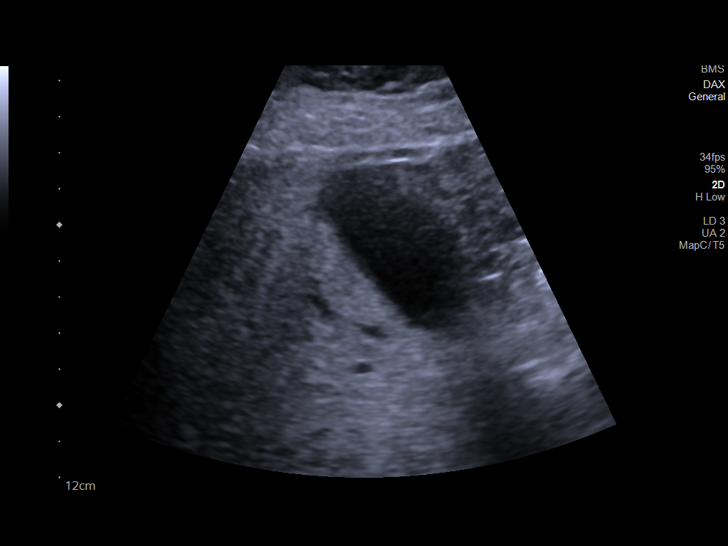
[im 9/51]
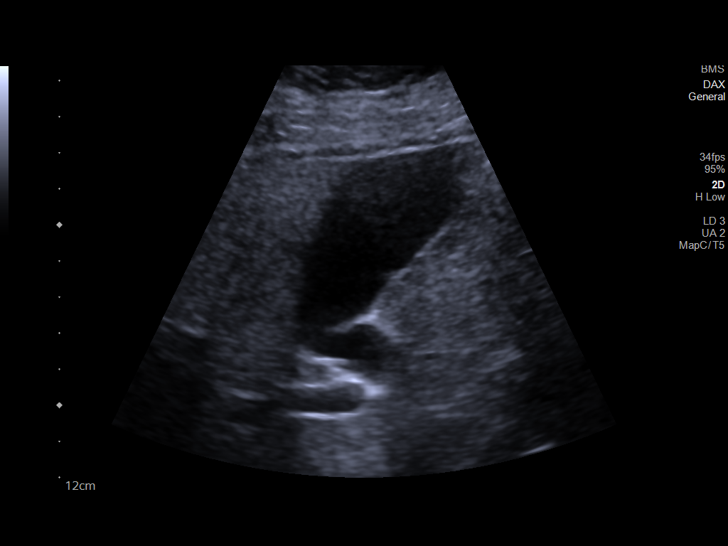
[im 13/51]
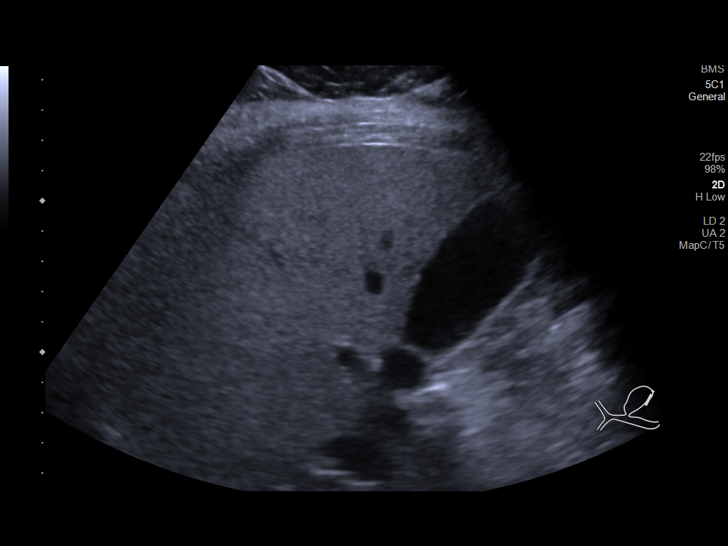
[im 17/51]
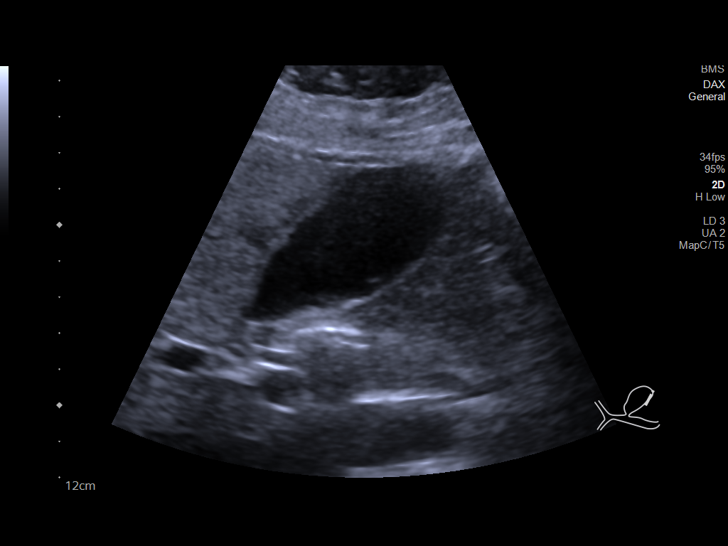
[im 19/51]
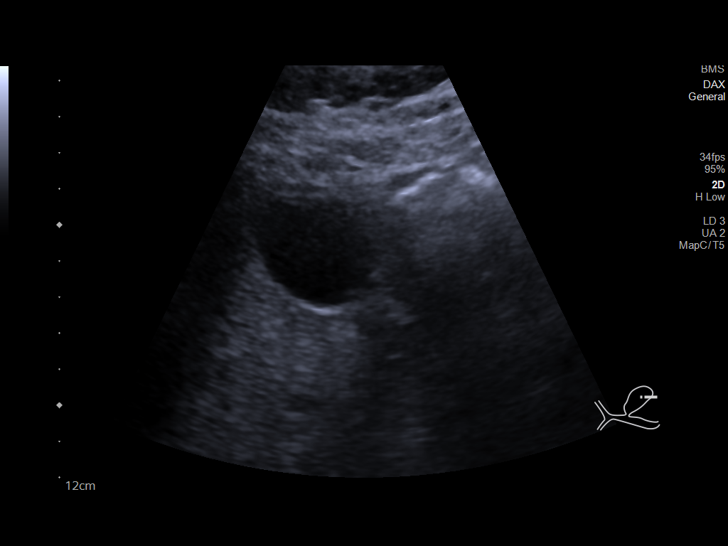
[im 23/51]
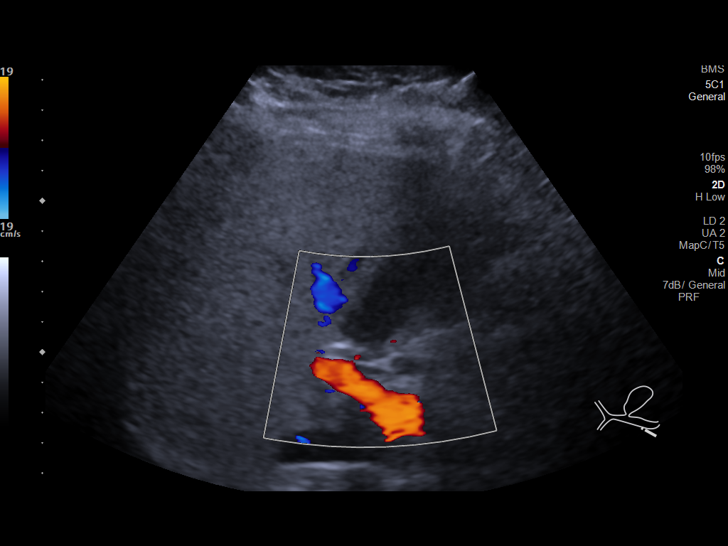
[im 28/51]
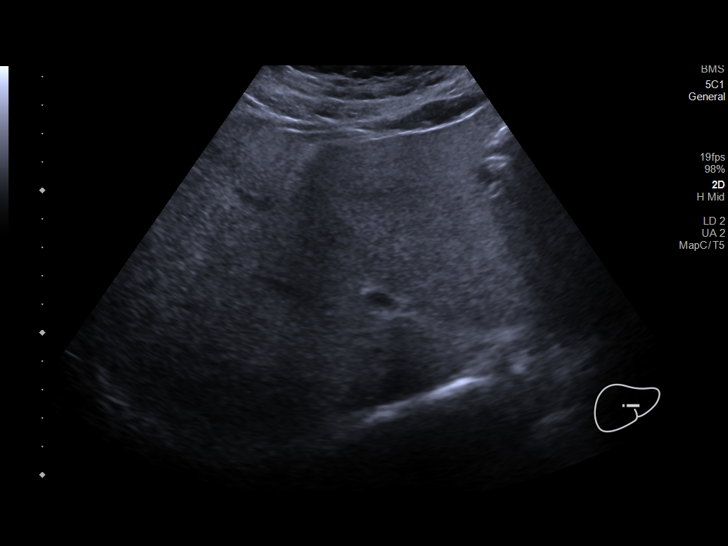
[im 32/51]
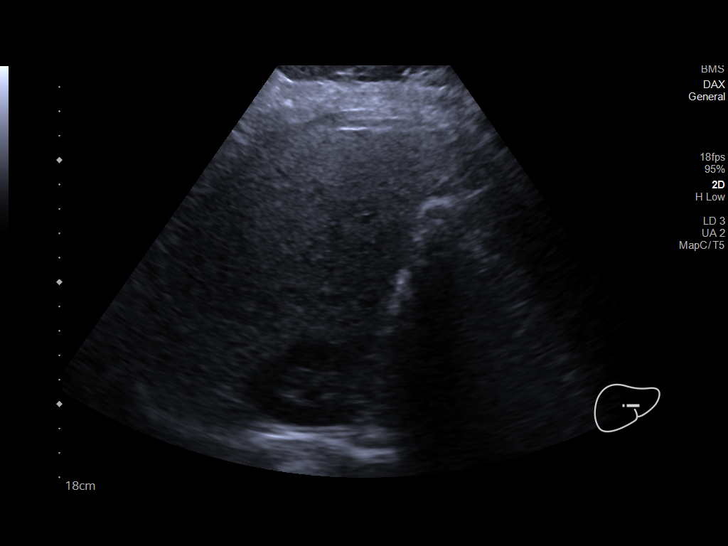
[im 34/51]
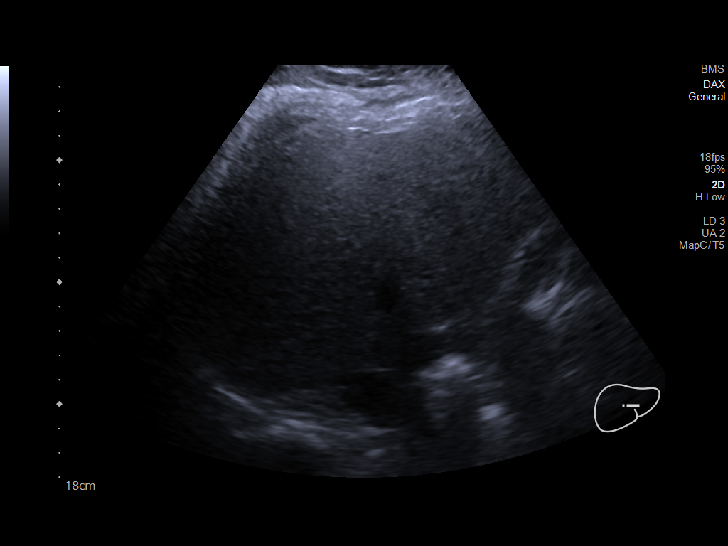
[im 38/51]
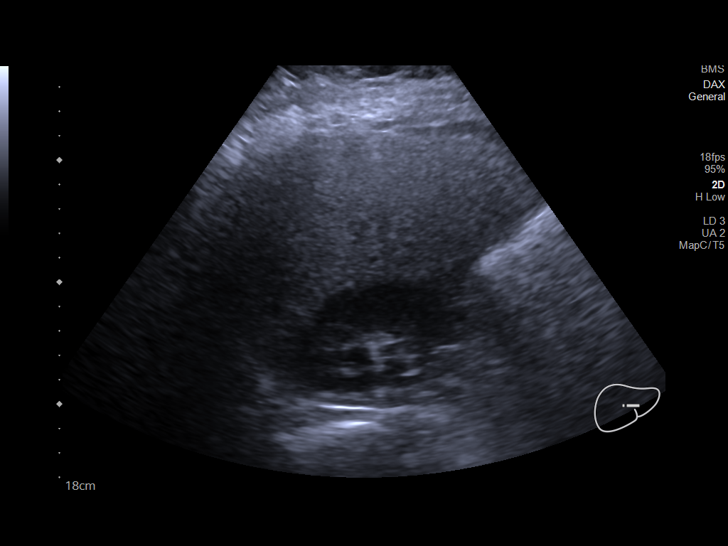
[im 42/51]
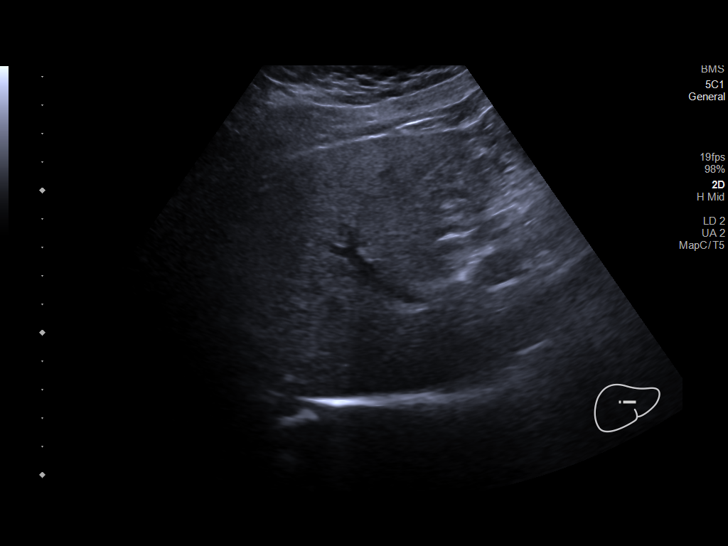
[im 46/51]
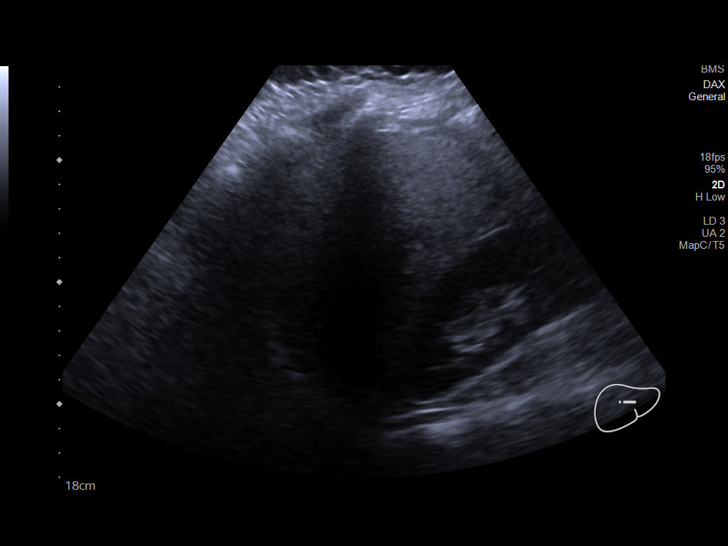
[im 51/51]
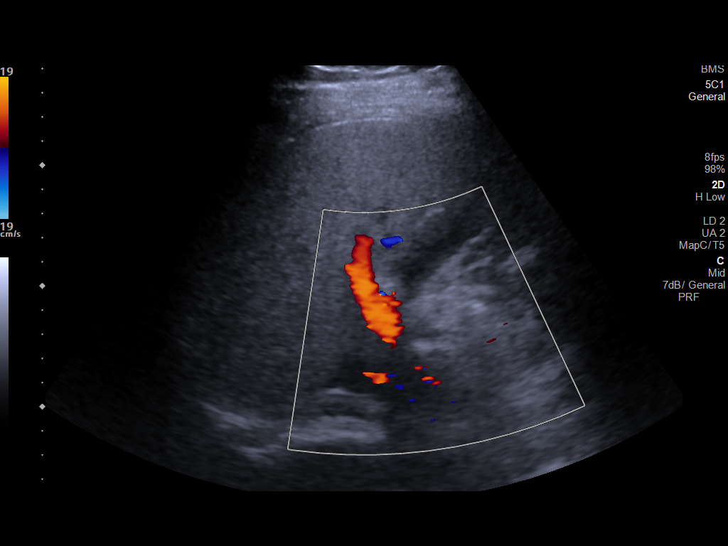

[14 of 25 positions shown; findings below may reference images not displayed]

FINDINGS: Gallbladder:

No gallstones or wall thickening visualized. No sonographic Murphy
sign noted by sonographer.

Common bile duct:

Diameter: 3 mm

Liver:

Increased parenchymal echogenicity. Lower echogenicity adjacent to
the gallbladder consistent with focal fatty sparing. No mass or
focal lesion. Portal vein is patent on color Doppler imaging with
normal direction of blood flow towards the liver.

Other: None.
IMPRESSION: 1. No acute findings.  Normal gallbladder.  No bile duct dilation.
2. Hepatic steatosis.

## 2022-05-11 ENCOUNTER — Other Ambulatory Visit: Payer: Self-pay

## 2022-05-11 ENCOUNTER — Ambulatory Visit (HOSPITAL_COMMUNITY)
Admission: EM | Admit: 2022-05-11 | Discharge: 2022-05-11 | Disposition: A | Discharge status: Home / Self Care | Payer: Medicaid Other | Attending: Emergency Medicine | Admitting: Emergency Medicine

## 2022-05-11 ENCOUNTER — Encounter (HOSPITAL_COMMUNITY): Payer: Self-pay | Admitting: *Deleted

## 2022-05-11 DIAGNOSIS — L237 Allergic contact dermatitis due to plants, except food: Secondary | ICD-10-CM | POA: Diagnosis not present

## 2022-05-11 MED ORDER — PREDNISONE 10 MG PO TABS: 40.0000 mg | ORAL_TABLET | Freq: Every day | ORAL | 0 refills | Status: DC

## 2022-05-11 NOTE — ED Provider Notes (Signed)
[facility]    CSN: [id number] Arrival date & time: 05/11/22  1039     History   Chief Complaint Chief Complaint  Patient presents with   Rash    HPI Anna Choi is a 26 y.o. female.  Presents with 1 week history of entire body rash.  Very itchy and she has been scratching at it.  She has tried hydrocortisone cream to some of the areas with minimal relief.  States it is on her face, neck, arms, chest, back, legs.  Denies any shortness of breath, trouble breathing, chest pain, abdominal pain, vomiting/diarrhea.  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   Asthma 02/11/2018   Low grade squamous intraepith lesion on cytologic smear cervix (lgsil) 02/11/2018   Supervision of normal first pregnancy, antepartum 01/21/2018    Past Surgical History:  Procedure Laterality Date   CESAREAN SECTION N/A 08/02/2018   Procedure: CESAREAN SECTION;  Surgeon: [place] Bing, MD;  Location: [facility] BIRTHING SUITES;  Service: Obstetrics;  Laterality: N/A;   TONSILLECTOMY     TYMPANOSTOMY TUBE PLACEMENT      OB History     Gravida  1   Para  1   Term  1   Preterm      AB      Living  1      SAB      IAB      Ectopic      Multiple  0   Live Births  1            Home Medications    Prior to Admission medications   Medication Sig Start Date End Date Taking? Authorizing Provider  predniSONE (DELTASONE) 10 MG tablet Take 4 tablets (40 mg total) by mouth daily for 5 days. 05/11/22 05/16/22 Yes Abuk Selleck, Lurena Joiner, PA-C  ondansetron (ZOFRAN ODT) 4 MG disintegrating tablet Take 1 tablet (4 mg total) by mouth every 8 (eight) hours as needed for nausea or vomiting. 12/16/19   Lorelee New, PA-C    Family History History reviewed. No pertinent family history.  Social  History Social History   Tobacco Use   Smoking status: Never   Smokeless tobacco: Never  Vaping Use   Vaping Use: Never used  Substance Use Topics   Alcohol use: No   Drug use: No     Allergies   Patient has no known allergies.   Review of Systems Review of Systems  Skin:  Positive for rash.   Per HPI  Physical Exam Triage Vital Signs ED Triage Vitals [05/11/22 1150]  Enc Vitals Group     BP      Pulse      Resp      Temp      Temp src      SpO2      Weight      Height      Head Circumference      Peak Flow      Pain Score 0     Pain Loc      Pain Edu?      Excl. in GC?    No data found.  Updated Vital Signs BP 111/72   Pulse 75   Temp (!) 97.3 F (36.3 C)   Resp 18   LMP 04/22/2022  SpO2 97%    Physical Exam Vitals and nursing note reviewed.  Constitutional:      General: She is not in acute distress.    Appearance: Normal appearance.  HENT:     Mouth/Throat:     Mouth: Mucous membranes are moist.     Pharynx: Oropharynx is clear. No posterior oropharyngeal erythema.  Eyes:     Conjunctiva/sclera: Conjunctivae normal.     Pupils: Pupils are equal, round, and reactive to light.  Cardiovascular:     Rate and Rhythm: Normal rate and regular rhythm.     Pulses: Normal pulses.     Heart sounds: Normal heart sounds.  Pulmonary:     Effort: Pulmonary effort is normal. No respiratory distress.     Breath sounds: Normal breath sounds. No wheezing.  Abdominal:     General: Bowel sounds are normal.     Tenderness: There is no abdominal tenderness.  Musculoskeletal:        General: Normal range of motion.     Cervical back: Normal range of motion.  Skin:    Findings: Rash present.     Comments: Erythematous, maculopapular, excoriated lesions.  Linear  Neurological:     Mental Status: She is alert and oriented to person, place, and time.     UC Treatments / Results  Labs (all labs ordered are listed, but only abnormal results are  displayed) Labs Reviewed - No data to display  EKG  Radiology No results found.  Procedures Procedures   Medications Ordered in UC Medications - No data to display  Initial Impression / Assessment and Plan / UC Course  I have reviewed the triage vital signs and the nursing notes.  Pertinent labs & imaging results that were available during my care of the patient were reviewed by me and considered in my medical decision making (see chart for details).  Rash appears consistent with poison ivy.  Due to extensive coverage of the rash we will do oral prednisone.  She will take 40 mg daily for the next 5 days.  Discussed possible side effects.  Patient agrees to plan.  She understands to go to the emergency department if she develops any worsening symptoms. Discharged in stable condition.  Final Clinical Impressions(s) / UC Diagnoses   Final diagnoses:  Poison ivy     Discharge Instructions      Please take the prednisone daily for 5 day.     ED Prescriptions     Medication Sig Dispense Auth. Provider   predniSONE (DELTASONE) 10 MG tablet Take 4 tablets (40 mg total) by mouth daily for 5 days. 20 tablet Adlee Paar, Lurena Joiner, PA-C      PDMP not reviewed this encounter.   Jceon Alverio, Ray Church 05/11/22 1209

## 2022-05-11 NOTE — Discharge Instructions (Addendum)
Please take the prednisone daily for 5 day.

## 2022-05-11 NOTE — ED Triage Notes (Signed)
Pt reports having a rash to Rt side of face ,neck. Pt also reports rash covering rest of body. Pt reports rash has been present for one week.

## 2022-05-14 ENCOUNTER — Encounter (HOSPITAL_COMMUNITY): Payer: Self-pay

## 2022-05-14 ENCOUNTER — Ambulatory Visit (HOSPITAL_COMMUNITY)
Admission: EM | Admit: 2022-05-14 | Discharge: 2022-05-14 | Disposition: A | Discharge status: Home / Self Care | Payer: Medicaid Other | Attending: Emergency Medicine | Admitting: Emergency Medicine

## 2022-05-14 DIAGNOSIS — L237 Allergic contact dermatitis due to plants, except food: Secondary | ICD-10-CM | POA: Diagnosis not present

## 2022-05-14 MED ORDER — PREDNISONE 10 MG (21) PO TBPK: ORAL_TABLET | Freq: Every day | ORAL | 0 refills | Status: AC

## 2022-05-14 NOTE — Discharge Instructions (Signed)
Take prednisone every morning with food as directed on packaging  For itching you may use your chin, Zyrtec or Benadryl, be mindful Benadryl will make you drowsy, you may also use Benadryl cream or calamine lotion as needed  Avoid scratching the areas as any breaks in the skin will put you at risk for any secondary infection, if you are tempted to touch the area, you may rub   May return for evaluation as needed if symptoms continue to persist or worsen

## 2022-05-14 NOTE — ED Provider Notes (Signed)
[facility]    CSN: [id number] Arrival date & time: 05/14/22  1000      History   Chief Complaint Chief Complaint  Patient presents with   Poison Ivy   Medication Reaction    HPI Anna Choi is a 26 y.o. female.   Patient presents today with rash present to the face, abdomen, back arms for 11 days.  Rash is pruritic.  Symptoms have not worsened but have not improved.  Patient was evaluated in urgent care on 617, diagnosed with poison ivy dermatitis and started on oral prednisone.  Patient endorses that she took 1 day of medication before dropping medication down the sink.  Has not attempted further treatment since.    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   Asthma 02/11/2018   Low grade squamous intraepith lesion on cytologic smear cervix (lgsil) 02/11/2018   Supervision of normal first pregnancy, antepartum 01/21/2018    Past Surgical History:  Procedure Laterality Date   CESAREAN SECTION N/A 08/02/2018   Procedure: CESAREAN SECTION;  Surgeon: Canjilon Bing, MD;  Location: [facility];  Service: Obstetrics;  Laterality: N/A;   TONSILLECTOMY     TYMPANOSTOMY TUBE PLACEMENT      OB History     Gravida  1   Para  1   Term  1   Preterm      AB      Living  1      SAB      IAB      Ectopic      Multiple  0   Live Births  1            Home Medications    Prior to Admission medications   Medication Sig Start Date End Date Taking? Authorizing Provider  ondansetron (ZOFRAN ODT) 4 MG disintegrating tablet Take 1 tablet (4 mg total) by mouth every 8 (eight) hours as needed for nausea or vomiting. 12/16/19   Lorelee New, PA-C  predniSONE (DELTASONE) 10 MG tablet Take 4 tablets (40 mg total) by mouth daily for 5 days. 05/11/22 05/16/22   Rising, Ray Church    Family History History reviewed. No pertinent family history.  Social History Social History   Tobacco Use   Smoking status: Never   Smokeless tobacco: Never  Vaping Use   Vaping Use: Never used  Substance Use Topics   Alcohol use: No   Drug use: No     Allergies   Asa [aspirin]   Review of Systems Review of Systems  Constitutional: Negative.   Respiratory: Negative.    Cardiovascular: Negative.   Skin:  Positive for rash. Negative for color change, pallor and wound.  Neurological: Negative.      Physical Exam Triage Vital Signs ED Triage Vitals [05/14/22 1051]  Enc Vitals Group     BP 128/90     Pulse Rate 77     Resp 18     Temp 98.3 F (36.8 C)     Temp Source Oral     SpO2 98 %     Weight      Height      Head Circumference      Peak Flow      Pain Score 0     Pain Loc  Pain Edu?      Excl. in GC?    No data found.  Updated Vital Signs BP 128/90 (BP Location: Left Arm)   Pulse 77   Temp 98.3 F (36.8 C) (Oral)   Resp 18   LMP 04/22/2022   SpO2 98%   Breastfeeding No   Visual Acuity Right Eye Distance:   Left Eye Distance:   Bilateral Distance:    Right Eye Near:   Left Eye Near:    Bilateral Near:     Physical Exam Constitutional:      Appearance: Normal appearance.  HENT:     Head: Normocephalic.  Eyes:     Extraocular Movements: Extraocular movements intact.  Pulmonary:     Effort: Pulmonary effort is normal.  Skin:    Comments: Erythematous papular blister like rash present to the lower chin, left upper and lower abdomen and right lower abdomen, the bilateral lower back, bilateral arms and legs  Neurological:     Mental Status: She is alert and oriented to person, place, and time. Mental status is at baseline.  Psychiatric:        Mood and Affect: Mood normal.        Behavior: Behavior normal.      UC Treatments / Results  Labs (all labs ordered are listed, but only abnormal results  are displayed) Labs Reviewed - No data to display  EKG   Radiology No results found.  Procedures Procedures (including critical care time)  Medications Ordered in UC Medications - No data to display  Initial Impression / Assessment and Plan / UC Course  I have reviewed the triage vital signs and the nursing notes.  Pertinent labs & imaging results that were available during my care of the patient were reviewed by me and considered in my medical decision making (see chart for details).  Poison ivy dermatitis  Prednisone 60 mg taper prescribed, discussed administration, may use oral or topical antihistamines or calamine lotion for management of pruritus, advised patient to not scratch the areas to prevent secondary infection, may wrap as needed, given strict precautions to return to urgent care for further evaluation if symptoms persist or worsen Final Clinical Impressions(s) / UC Diagnoses   Final diagnoses:  None   Discharge Instructions   None    ED Prescriptions   None    PDMP not reviewed this encounter.   Valinda Hoar, NP 05/14/22 1109

## 2022-05-14 NOTE — ED Triage Notes (Signed)
Pt c/o poison ivy x2 wks. States seen and tx'd 3 days ago but dropped her meds down the sink.
